# Patient Record
Sex: Male | Born: 1935 | Race: White | Hispanic: No | Marital: Married | Smoking: Never smoker
Health system: Southern US, Community
[De-identification: ages and names within clinical notes are randomized; demographics above are authoritative.]

## PROBLEM LIST (undated history)

## (undated) DIAGNOSIS — R5383 Other fatigue: Secondary | ICD-10-CM

## (undated) DIAGNOSIS — E785 Hyperlipidemia, unspecified: Secondary | ICD-10-CM

## (undated) DIAGNOSIS — I341 Nonrheumatic mitral (valve) prolapse: Secondary | ICD-10-CM

## (undated) DIAGNOSIS — I480 Paroxysmal atrial fibrillation: Secondary | ICD-10-CM

## (undated) HISTORY — DX: Other fatigue: R53.83

## (undated) HISTORY — PX: TONSILLECTOMY AND ADENOIDECTOMY: SUR1326

## (undated) HISTORY — DX: Hyperlipidemia, unspecified: E78.5

## (undated) HISTORY — DX: Paroxysmal atrial fibrillation: I48.0

## (undated) HISTORY — DX: Nonrheumatic mitral (valve) prolapse: I34.1

---

## 2003-11-07 ENCOUNTER — Ambulatory Visit (HOSPITAL_COMMUNITY): Admission: RE | Admit: 2003-11-07 | Discharge: 2003-11-07 | Payer: Self-pay | Admitting: Gastroenterology

## 2004-06-19 ENCOUNTER — Inpatient Hospital Stay (HOSPITAL_COMMUNITY): Admission: EM | Admit: 2004-06-19 | Discharge: 2004-06-22 | Payer: Self-pay | Admitting: Emergency Medicine

## 2004-06-19 HISTORY — PX: CARDIAC CATHETERIZATION: SHX172

## 2004-07-17 ENCOUNTER — Emergency Department (HOSPITAL_COMMUNITY): Admission: EM | Admit: 2004-07-17 | Discharge: 2004-07-17 | Payer: Self-pay | Admitting: Emergency Medicine

## 2006-04-14 ENCOUNTER — Inpatient Hospital Stay (HOSPITAL_COMMUNITY): Admission: EM | Admit: 2006-04-14 | Discharge: 2006-04-17 | Payer: Self-pay | Admitting: Emergency Medicine

## 2006-04-14 HISTORY — PX: CARDIOVERSION: SHX1299

## 2012-10-28 ENCOUNTER — Telehealth: Payer: Self-pay | Admitting: Cardiovascular Disease

## 2012-10-28 MED ORDER — PROPAFENONE HCL 150 MG PO TABS: 150.0000 mg | ORAL_TABLET | Freq: Three times a day (TID) | ORAL | Status: DC

## 2012-10-28 NOTE — Telephone Encounter (Signed)
Rx was sent to pharmacy electronically. 

## 2012-10-28 NOTE — Telephone Encounter (Signed)
No mess

## 2013-01-07 ENCOUNTER — Other Ambulatory Visit: Payer: Self-pay

## 2013-01-07 MED ORDER — ATENOLOL 25 MG PO TABS: 25.0000 mg | ORAL_TABLET | Freq: Every day | ORAL | Status: DC

## 2013-01-07 NOTE — Telephone Encounter (Signed)
Rx was sent to pharmacy electronically. 

## 2013-01-13 ENCOUNTER — Other Ambulatory Visit: Payer: Self-pay | Admitting: Cardiovascular Disease

## 2013-01-13 NOTE — Telephone Encounter (Signed)
Rx was sent to pharmacy electronically. 

## 2013-01-19 ENCOUNTER — Other Ambulatory Visit: Payer: Self-pay | Admitting: *Deleted

## 2013-01-19 MED ORDER — PROPAFENONE HCL 150 MG PO TABS: 150.0000 mg | ORAL_TABLET | Freq: Three times a day (TID) | ORAL | Status: DC

## 2013-01-19 NOTE — Telephone Encounter (Signed)
Rx was sent to pharmacy electronically. 

## 2013-06-21 ENCOUNTER — Other Ambulatory Visit: Payer: Self-pay | Admitting: *Deleted

## 2013-06-21 MED ORDER — PROPAFENONE HCL 150 MG PO TABS: 150.0000 mg | ORAL_TABLET | Freq: Three times a day (TID) | ORAL | Status: DC

## 2013-06-21 NOTE — Telephone Encounter (Signed)
Rx refill sent to patient pharmacy   

## 2013-06-22 ENCOUNTER — Other Ambulatory Visit: Payer: Self-pay | Admitting: *Deleted

## 2013-07-14 ENCOUNTER — Other Ambulatory Visit: Payer: Self-pay | Admitting: Cardiovascular Disease

## 2013-07-14 MED ORDER — ROSUVASTATIN CALCIUM 10 MG PO TABS: 10.0000 mg | ORAL_TABLET | Freq: Every day | ORAL | Status: DC

## 2013-07-28 ENCOUNTER — Telehealth: Payer: Self-pay | Admitting: Cardiovascular Disease

## 2013-07-28 MED ORDER — PROPAFENONE HCL 150 MG PO TABS: 150.0000 mg | ORAL_TABLET | Freq: Three times a day (TID) | ORAL | Status: DC

## 2013-07-28 NOTE — Telephone Encounter (Signed)
Rx was sent to pharmacy electronically. Patient notified.  

## 2013-07-28 NOTE — Telephone Encounter (Signed)
He need enough medicine until his appt on Monday,08-02-13 please. Please call his Propafenone HCL 150 mg. Please call to [facility] [phone].

## 2013-08-02 ENCOUNTER — Encounter: Payer: Self-pay | Admitting: Cardiology

## 2013-08-02 ENCOUNTER — Ambulatory Visit (INDEPENDENT_AMBULATORY_CARE_PROVIDER_SITE_OTHER): Payer: Medicare Other | Admitting: Cardiology

## 2013-08-02 VITALS — BP 126/82 | HR 55 | Ht 73.5 in | Wt 184.8 lb

## 2013-08-02 DIAGNOSIS — I341 Nonrheumatic mitral (valve) prolapse: Secondary | ICD-10-CM

## 2013-08-02 DIAGNOSIS — I059 Rheumatic mitral valve disease, unspecified: Secondary | ICD-10-CM

## 2013-08-02 DIAGNOSIS — I4891 Unspecified atrial fibrillation: Secondary | ICD-10-CM

## 2013-08-02 DIAGNOSIS — I480 Paroxysmal atrial fibrillation: Secondary | ICD-10-CM

## 2013-08-02 DIAGNOSIS — I340 Nonrheumatic mitral (valve) insufficiency: Secondary | ICD-10-CM

## 2013-08-02 DIAGNOSIS — E785 Hyperlipidemia, unspecified: Secondary | ICD-10-CM

## 2013-08-02 DIAGNOSIS — Z79899 Other long term (current) drug therapy: Secondary | ICD-10-CM

## 2013-08-02 LAB — LIPID PANEL
CHOL/HDL RATIO: 2.7 ratio
CHOLESTEROL: 154 mg/dL (ref 0–200)
HDL: 57 mg/dL (ref >39)
LDL Cholesterol: 80 mg/dL (ref 0–99)
TRIGLYCERIDES: 84 mg/dL (ref <150)
VLDL: 17 mg/dL (ref 0–40)

## 2013-08-02 LAB — COMPREHENSIVE METABOLIC PANEL WITH GFR
ALT: 10 U/L (ref 0–53)
AST: 15 U/L (ref 0–37)
Albumin: 4.1 g/dL (ref 3.5–5.2)
Alkaline Phosphatase: 41 U/L (ref 39–117)
BUN: 17 mg/dL (ref 6–23)
CO2: 27 meq/L (ref 19–32)
Calcium: 9 mg/dL (ref 8.4–10.5)
Chloride: 102 meq/L (ref 96–112)
Creat: 0.93 mg/dL (ref 0.50–1.35)
Glucose, Bld: 83 mg/dL (ref 70–99)
Potassium: 4.5 meq/L (ref 3.5–5.3)
Sodium: 137 meq/L (ref 135–145)
Total Bilirubin: 1.4 mg/dL — ABNORMAL HIGH (ref 0.2–1.2)
Total Protein: 6.1 g/dL (ref 6.0–8.3)

## 2013-08-02 MED ORDER — ATENOLOL 25 MG PO TABS: 12.5000 mg | ORAL_TABLET | Freq: Every day | ORAL | Status: DC

## 2013-08-02 MED ORDER — ROSUVASTATIN CALCIUM 10 MG PO TABS: 10.0000 mg | ORAL_TABLET | Freq: Every day | ORAL | Status: DC

## 2013-08-02 MED ORDER — PROPAFENONE HCL 150 MG PO TABS: 150.0000 mg | ORAL_TABLET | Freq: Three times a day (TID) | ORAL | Status: DC

## 2013-08-02 NOTE — Progress Notes (Signed)
Patient ID: Gabriel Schmidt, male   DOB: [birth year], 79 y.o.   MRN: [id number]    08/02/2013 Gabriel Schmidt   December 22, 1934  [id number]  Primary Physicia  Melinda Crutch, MD Primary Cardiologist: Dr. Claiborne Billings  HPI:  The patient is a 79 year old male, followed by Dr. Claiborne Billings. His past medical history is significant for paroxysmal atrial fibrillation, with his last episode being in February 2008. He has been on a combination of Rythmol as well as low-dose beta blockage with atenolol. His Coumadin was discontinued in September 2011, due to a low CHADS score. He has been maintained on 325 mg of aspirin daily. He also has a history of hyperlipidemia, currently on Crestor,  as well as mitral valve prolapse with mild mitral regurgitation.  His last office visit was with Dr. Claiborne Billings over a year ago in March 2014. At that time he was doing fairly well. An EKG showed sinus bradycardia with first-degree AV block with PR interval of 217 ms. QTc interval was 424 ms. He had endorse fatigability and since he was bradycardic, Dr. Claiborne Billings reduced his atenolol dose from 25 mg to 12.5 mg. He was continued on Rythmol at a dose of 150 mg TID, as well as full dose aspirin. A lipid panel was also ordered, which demonstrated a total cholesterol 175, triglycerides 74, HDL 72 and LDL 88. He had normal LFTs. He was continued on 10 mg of Crestor.  Presents back to clinic today for routine evaluation. He states that he has been doing well since his last office visit with Dr. Claiborne Billings. He has not had any issues and has no complaints. He denies chest pain, shortness of breath, orthopnea, PND, LLL, dizziness, fatigue, syncope/near-syncope. He has been fully compliant with his medications. He reports that he is in need for refills for his Rythmol, atenolol and Crestor. His only request is if he can discontinue some of his medications.    Current Outpatient Prescriptions  Medication Sig Dispense Refill  . aspirin 325 MG tablet Take 325 mg by mouth  daily.      Marland Kitchen atenolol (TENORMIN) 25 MG tablet Take 0.5 tablets (12.5 mg total) by mouth daily.  30 tablet  6  . propafenone (RYTHMOL) 150 MG tablet Take 1 tablet (150 mg total) by mouth every 8 (eight) hours.  30 tablet  6  . rosuvastatin (CRESTOR) 10 MG tablet Take 1 tablet (10 mg total) by mouth daily. Need appointments before further refills  90 tablet  3   No current facility-administered medications for this visit.    Allergies  Allergen Reactions  . Penicillins     History   Social History  . Marital Status: Married    Spouse Name: N/A    Number of Children: N/A  . Years of Education: N/A   Occupational History  . Not on file.   Social History Main Topics  . Smoking status: Never Smoker   . Smokeless tobacco: Never Used  . Alcohol Use: 1.5 - 2.0 oz/week    3-4 drink(s) per week  . Drug Use: Not on file  . Sexual Activity: Not on file   Other Topics Concern  . Not on file   Social History Narrative  . No narrative on file     Review of Systems: General: negative for chills, fever, night sweats or weight changes.  Cardiovascular: negative for chest pain, dyspnea on exertion, edema, orthopnea, palpitations, paroxysmal nocturnal dyspnea or shortness of breath Dermatological: negative for rash Respiratory: negative for cough  or wheezing Urologic: negative for hematuria Abdominal: negative for nausea, vomiting, diarrhea, bright red blood per rectum, melena, or hematemesis Neurologic: negative for visual changes, syncope, or dizziness All other systems reviewed and are otherwise negative except as noted above.    Blood pressure 126/82, pulse 55, height 6' 1.5" (1.867 m), weight 184 lb 12.8 oz (83.825 kg).  General appearance: alert, cooperative and no distress Neck: no carotid bruit and no JVD Lungs: clear to auscultation bilaterally Heart: regular rate and rhythm and 1/6 SM Extremities: no LEE Pulses: 2+ and symmetric Skin: warm and dry Neurologic: Grossly  normal  EKG Sinus bradycardia, HR 55 bpm. QT/QTc 456/436 ms  ASSESSMENT AND PLAN:  79 year old, well appearing, male a history of PAF, hyperlipidemia and mitral valve prolapse who presents to clinic for routine cardiac evaluation.  1. Paroxysmal atrial fibrillation: Stable. He denies any recurrence/ symptoms of AFib. His EKG demonstrates sinus bradycardia w/ HR of 55 bpm. He apparently has not had an episode of Afib since 2008 and he wishes to discontinue his medications. However, I question whether or not he has maintained sinus rhythm because he has been medicated with rhythm/rate control therapies. I feel that this decision will need to be made by his primary cardiologist, Dr. Claiborne Billings. For now, I have instructed him to continue Rythmol, atenolol and full dose ASA, until he is seen by Dr. Claiborne Billings.   2. Hyperlipidemia: He reports daily compliance with Crestor. It has been over a year since his last lipid study. He tells me that he has been fasting and has had nothing to eat or drink since midnight. Will repeat lipid panel today. Continue 10 mg of Crestor for now. We'll also check a CMP to assess hepatic function as well as renal function.  3. Mitral valve prolapse with mild mitral regurgitation: His murmur remaines soft. He denies any symptoms of heart failure, including dyspnea on exertion, resting dyspnea, orthopnea, PND and LEE.  PLAN  Continue plan as outlined above. I his Rythmol, atenolol and Crestor reordered. He has been instructed to followup with Dr. Claiborne Billings in 3-6 months for reevaluation or sooner if needed.  Gabriel Shock SimmonsPA-C 08/02/2013 8:32 AM

## 2013-08-02 NOTE — Patient Instructions (Signed)
Your physician recommends that you schedule a follow-up appointment in: 6 Months or sooner if needed  Your physician has recommended you make the following change in your medication: Decrease Atenolol to 12.5(1/2) tablets daily  Your physician recommends that you return for lab work CMP, FASTING LIPIDS

## 2013-08-17 ENCOUNTER — Other Ambulatory Visit: Payer: Self-pay | Admitting: *Deleted

## 2013-08-17 MED ORDER — PROPAFENONE HCL 150 MG PO TABS: 150.0000 mg | ORAL_TABLET | Freq: Three times a day (TID) | ORAL | Status: DC

## 2013-08-17 NOTE — Telephone Encounter (Signed)
Rx refill sent into patient pharmacy

## 2013-09-01 ENCOUNTER — Telehealth: Payer: Self-pay | Admitting: Cardiovascular Disease

## 2013-09-01 NOTE — Telephone Encounter (Signed)
Spoke with pt, lab results given to pt and mailed to his home address at his request.

## 2013-09-01 NOTE — Telephone Encounter (Signed)
Pt would like his lab results from June please.

## 2014-01-28 ENCOUNTER — Other Ambulatory Visit: Payer: Self-pay | Admitting: *Deleted

## 2014-01-28 MED ORDER — ATENOLOL 25 MG PO TABS: 12.5000 mg | ORAL_TABLET | Freq: Every day | ORAL | Status: DC

## 2014-01-28 MED ORDER — PROPAFENONE HCL 150 MG PO TABS: 150.0000 mg | ORAL_TABLET | Freq: Three times a day (TID) | ORAL | Status: DC

## 2014-01-28 NOTE — Telephone Encounter (Signed)
Refilled electronically 

## 2014-02-02 ENCOUNTER — Other Ambulatory Visit: Payer: Self-pay

## 2014-02-02 MED ORDER — ATENOLOL 25 MG PO TABS: 12.5000 mg | ORAL_TABLET | Freq: Every day | ORAL | Status: DC

## 2014-02-02 MED ORDER — PROPAFENONE HCL 150 MG PO TABS: 150.0000 mg | ORAL_TABLET | Freq: Three times a day (TID) | ORAL | Status: DC

## 2014-02-02 NOTE — Telephone Encounter (Signed)
Rx sent to pharmacy   

## 2014-02-02 NOTE — Telephone Encounter (Signed)
Rx was sent to pharmacy electronically. 

## 2014-03-21 ENCOUNTER — Telehealth: Payer: Self-pay | Admitting: Cardiovascular Disease

## 2014-03-21 NOTE — Telephone Encounter (Signed)
Mrs. Haffey states that her husband has been experiencing a low heart rate (47-52) since last Friday 03/18/14.

## 2014-03-21 NOTE — Telephone Encounter (Signed)
Is he still taking propefenone and atenolol. If so, then can dc atenolol if only taking 12.5 mg. Rec f/u ov

## 2014-03-21 NOTE — Telephone Encounter (Signed)
Returned call to patient's wife she stated she wanted to ask Dr.Kelly if husband needs to decrease medication.Stated he has been exercising regular.Stated B/P ranging 98/51,102/53,124/70,128/71.Pulse ranging [phone] [phone].Stated she was concerned when pulse was 48,47.Stated he felt good.Advised pulse ok.Follow up appointment scheduled with [facility] 04/13/14 at 10:15 am.Message sent to [facility] for advice.

## 2014-03-22 NOTE — Telephone Encounter (Signed)
Spoke to patient. He verified current dose of atenolol 12.5mg  daily. Communicated Dr. Evette Georges instructions, patient voiced understanding, pt will d/c Atenolol. Will keep OV for 2/17 and call for any concerns.

## 2014-04-13 ENCOUNTER — Ambulatory Visit (INDEPENDENT_AMBULATORY_CARE_PROVIDER_SITE_OTHER): Payer: Medicare Other | Admitting: Cardiovascular Disease

## 2014-04-13 ENCOUNTER — Encounter: Payer: Self-pay | Admitting: Cardiovascular Disease

## 2014-04-13 VITALS — BP 118/70 | HR 59 | Ht 74.0 in | Wt 182.9 lb

## 2014-04-13 DIAGNOSIS — I340 Nonrheumatic mitral (valve) insufficiency: Secondary | ICD-10-CM

## 2014-04-13 DIAGNOSIS — Z79899 Other long term (current) drug therapy: Secondary | ICD-10-CM

## 2014-04-13 DIAGNOSIS — I480 Paroxysmal atrial fibrillation: Secondary | ICD-10-CM

## 2014-04-13 DIAGNOSIS — E785 Hyperlipidemia, unspecified: Secondary | ICD-10-CM

## 2014-04-13 DIAGNOSIS — I341 Nonrheumatic mitral (valve) prolapse: Secondary | ICD-10-CM

## 2014-04-13 NOTE — Patient Instructions (Addendum)
Your physician recommends that you return for lab work fasting.  Your physician recommends that you schedule a follow-up appointment in: 3 months with Dr. Claiborne Billings.  Dr Claiborne Billings wants you to follow the schedule below for the propafenone:  Take 1 tablet twice daily for 2 weeks.  Then 1/2 tablet twice a day for 1 week.  Then 1/2 tablet daily for 1 week then stop!  2 days later restart the atenolol @ 1/2 tablet daily for 2 weeks then take 1/2 tablet twice a day.  Decrease the aspirin to 81 mg.

## 2014-04-15 ENCOUNTER — Encounter: Payer: Self-pay | Admitting: Cardiovascular Disease

## 2014-04-15 NOTE — Progress Notes (Signed)
Patient ID: Gabriel Schmidt, male   DOB: Mar 29, 1934, 80 y.o.   MRN: [id number]     HPI: Gabriel Schmidt is a 80 y.o. male who presents to the office today for a 23 month follow up cardiology evaluation.  Gabriel Schmidt is a 80 year old gentleman who has a history of paroxysmal atrial fibrillation with 2 documented episodes over the past 10 years. One episode was in 2006 and his last episode was in February 2008.  He has been on combination therapy with Rythmol as well as low-dose beta blocker therapy.  He had been maintained on Coumadin and this was ultimately discontinued in September 2011.  He also has a history of hyperlipidemia for which she's been treated with Crestor 10 mg, mitral valve prolapse with mild MR, and remotely had noted some fatigability.  I last saw him in March 2014 at which time he was doing well on current therapy.  He apparently had seen Gabriel Schmidt in June 2015 and at that time expressed to her his wishes to discontinue Rythmol if possible.  She did not feel comfortable making that decision.  Over the past 8 months, he continues to remain stable.  In the past the episodes of paroxysmal atrial fibrillation occurred in the setting of more significant alcohol intake.  Presently, he is exercising regularly.  He works out 5 days per week.  He has remained in good physical shape.  He denies any weight gain.  He denies chest pain.  He denies shortness of breath.  He denies bleeding.  I did review recent blood work which was done at [place] in December 2015.  Hemoglobin 15, hematocrit 45.4.  Platelet count was normal at 255.  Lipids were excellent with a total cholesterol 153, triglycerides 89, HDL 57, LDL 78.   History reviewed. No pertinent past medical history.  History reviewed. No pertinent past surgical history.  Allergies  Allergen Reactions  . Penicillins     Current Outpatient Prescriptions  Medication Sig Dispense Refill  . aspirin 81 MG tablet Take 81 mg by mouth daily.     . Omega-3 Fatty Acids (ULTRA OMEGA-3 FISH OIL) 1400 MG CAPS Take 1 capsule by mouth daily.    . propafenone (RYTHMOL) 150 MG tablet Take 1 tablet (150 mg total) by mouth every 8 (eight) hours. <please make appointment for future refills> 270 tablet 0  . rosuvastatin (CRESTOR) 10 MG tablet Take 1 tablet (10 mg total) by mouth daily. Need appointments before further refills 90 tablet 3  . atenolol (TENORMIN) 25 MG tablet Take 0.5 tablets (12.5 mg total) by mouth daily. Need an appointment for additional refills (Patient not taking: Reported on 04/13/2014) 90 tablet 0   No current facility-administered medications for this visit.    History   Social History  . Marital Status: Married    Spouse Name: N/A  . Number of Children: N/A  . Years of Education: N/A   Occupational History  . Not on file.   Social History Main Topics  . Smoking status: Never Smoker   . Smokeless tobacco: Never Used  . Alcohol Use: 1.5 - 2.0 oz/week    3-4 Standard drinks or equivalent per week  . Drug Use: Not on file  . Sexual Activity: Not on file   Other Topics Concern  . Not on file   Social History Narrative   Socially he is retired.  He has 3 children.  He previously was a [employer].  Family history is notable that  both parents are deceased.  Father died of a stroke.  ROS General: Negative; No fevers, chills, or night sweats HEENT: Negative; No changes in vision or hearing, sinus congestion, difficulty swallowing Pulmonary: Negative; No cough, wheezing, shortness of breath, hemoptysis Cardiovascular: See HPI: No chest pain, presyncope, syncope, palpatations GI: Negative; No nausea, vomiting, diarrhea, or abdominal pain GU: Negative; No dysuria, hematuria, or difficulty voiding Musculoskeletal: Negative; no myalgias, joint pain, or weakness Hematologic: Negative; no easy bruising, bleeding Endocrine: Negative; no heat/cold intolerance; no diabetes, Neuro: Negative; no changes in  balance, headaches Skin: Negative; No rashes or skin lesions Psychiatric: Negative; No behavioral problems, depression Sleep: Negative; No snoring,  daytime sleepiness, hypersomnolence, bruxism, restless legs, hypnogognic hallucinations. Other comprehensive 14 point system review is negative   Physical Exam BP 118/70 mmHg  Pulse 59  Ht '6\' 2"'  (1.88 m)  Wt 182 lb 14.4 oz (82.963 kg)  BMI 23.47 kg/m2 General: Alert, oriented, no distress.  Skin: normal turgor, no rashes, warm and dry HEENT: Normocephalic, atraumatic. Pupils equal round and reactive to light; sclera anicteric; extraocular muscles intact, No lid lag; Nose without nasal septal hypertrophy; Mouth/Parynx benign; Mallinpatti scale 2 Neck: No JVD, no carotid bruits; normal carotid upstroke Lungs: clear to ausculatation and percussion bilaterally; no wheezing or rales, normal inspiratory and expiratory effort Chest wall: without tenderness to palpitation Heart: PMI not displaced, RRR, s1 s2 normal, 1/6 systolic murmur, No diastolic murmur, no rubs, gallops, thrills, or heaves Abdomen: soft, nontender; no hepatosplenomehaly, BS+; abdominal aorta nontender and not dilated by palpation. Back: no CVA tenderness Pulses: 2+  Musculoskeletal: full range of motion, normal strength, no joint deformities Extremities: Pulses 2+, no clubbing cyanosis or edema, Homan's sign negative  Neurologic: grossly nonfocal; Cranial nerves grossly wnl Psychologic: Normal mood and affect   ECG (independently read by me): Sinus bradycardia 59 bpm.  Borderline first-degree AV block with a PR interval of 24 ms.  No significant ST segment changes.  LABS:  BMP Latest Ref Rng 08/02/2013  Glucose 70 - 99 mg/dL 83  BUN 6 - 23 mg/dL 17  Creatinine 0.50 - 1.35 mg/dL 0.93  Sodium 135 - 145 mEq/L 137  Potassium 3.5 - 5.3 mEq/L 4.5  Chloride 96 - 112 mEq/L 102  CO2 19 - 32 mEq/L 27  Calcium 8.4 - 10.5 mg/dL 9.0     Hepatic Function Latest Ref Rng 08/02/2013    Total Protein 6.0 - 8.3 g/dL 6.1  Albumin 3.5 - 5.2 g/dL 4.1  AST 0 - 37 U/L 15  ALT 0 - 53 U/L 10  Alk Phosphatase 39 - 117 U/L 41  Total Bilirubin 0.2 - 1.2 mg/dL 1.4(H)     No flowsheet data found.   BNP No results found for: BNP  ProBNP No results found for: PROBNP   Lipid Panel     Component Value Date/Time   CHOL 154 08/02/2013 0835   TRIG 84 08/02/2013 0835   HDL 57 08/02/2013 0835   CHOLHDL 2.7 08/02/2013 0835   VLDL 17 08/02/2013 0835   LDLCALC 80 08/02/2013 0835     RADIOLOGY: No results found.    ASSESSMENT AND PLAN: Mr. Gabriel Schmidt has had 2 documented episodes of atrial fibrillation over the last 10 years with one in 2006 and his last episode in 2008.  Remotely, he had experienced a previous episode that was short lived while [place] in [place], [place].  He would like to see if he can discontinue his per puff alone.  He had  been maintained on atenolol, but since he had been feeling well over the past several months.  He actually has self discontinued this beta blocker regimen.  Discussion, I have consented to a trial of slow wean off Rythmol with re-initiation of atenolol.  Presently, he has been taking Rythmol 150 mg every 8 hours.  For the next 2 weeks she will reduce this to twice a day and then he will reduce this to one half twice a day for 1 week and then one half daily for 1 week prior to completely discontinuing therapy.  He will resume atenolol at 12.5 mg daily and after's several weeks.  He will then further titrate this to 12.5 mg twice a day.  He will contact me if he is unaware of any recurrent arrhythmia.  He was current aspirin, but I have suggested he change this to 81 mg.  I reviewed his recent laboratory.  His lipids are excellent.  His blood pressure today is well controlled.  I will see him in 3 months for reevaluation or sooner if problems exist.     Troy Sine, MD, [facility]  04/15/2014 6:48 PM

## 2014-04-26 ENCOUNTER — Encounter: Payer: Self-pay | Admitting: Cardiovascular Disease

## 2014-04-28 ENCOUNTER — Encounter: Payer: Self-pay | Admitting: *Deleted

## 2014-06-30 ENCOUNTER — Other Ambulatory Visit: Payer: Self-pay | Admitting: Cardiovascular Disease

## 2014-06-30 MED ORDER — ATENOLOL 25 MG PO TABS: 12.5000 mg | ORAL_TABLET | Freq: Two times a day (BID) | ORAL | Status: DC

## 2014-06-30 NOTE — Telephone Encounter (Signed)
°  1. Which medications need to be refilled? Atenolol (dosage change to 1 tab po a day )  2. Which pharmacy is medication to be sent to?[facility]  3. Do they need a 30 day or 90 day supply? 90  4. Would they like a call back once the medication has been sent to the pharmacy? Yes

## 2014-06-30 NOTE — Telephone Encounter (Signed)
Rx(s) sent to pharmacy electronically. Patient notified. 

## 2014-07-13 ENCOUNTER — Other Ambulatory Visit: Payer: Self-pay | Admitting: Cardiovascular Disease

## 2014-07-14 LAB — CBC/DIFF AMBIGUOUS DEFAULT
BASOS ABS: 0 10*3/uL (ref 0.0–0.2)
Basos: 0 %
EOS (ABSOLUTE): 0.2 10*3/uL (ref 0.0–0.4)
Eos: 5 %
Hematocrit: 43.9 % (ref 37.5–51.0)
Hemoglobin: 15 g/dL (ref 12.6–17.7)
Immature Grans (Abs): 0 10*3/uL (ref 0.0–0.1)
Immature Granulocytes: 0 %
LYMPHS ABS: 1.5 10*3/uL (ref 0.7–3.1)
Lymphs: 31 %
MCH: 30.8 pg (ref 26.6–33.0)
MCHC: 34.2 g/dL (ref 31.5–35.7)
MCV: 90 fL (ref 79–97)
MONOS ABS: 0.6 10*3/uL (ref 0.1–0.9)
Monocytes: 12 %
NEUTROS ABS: 2.5 10*3/uL (ref 1.4–7.0)
NEUTROS PCT: 52 %
PLATELETS: 263 10*3/uL (ref 150–379)
RBC: 4.87 x10E6/uL (ref 4.14–5.80)
RDW: 13.1 % (ref 12.3–15.4)
WBC: 4.8 10*3/uL (ref 3.4–10.8)

## 2014-07-14 LAB — COMPREHENSIVE METABOLIC PANEL
ALBUMIN: 4 g/dL (ref 3.5–4.8)
ALT: 11 IU/L (ref 0–44)
AST: 20 IU/L (ref 0–40)
Albumin/Globulin Ratio: 2.1 (ref 1.1–2.5)
Alkaline Phosphatase: 47 IU/L (ref 39–117)
BUN/Creatinine Ratio: 14 (ref 10–22)
BUN: 14 mg/dL (ref 8–27)
Bilirubin Total: 1 mg/dL (ref 0.0–1.2)
CALCIUM: 9.1 mg/dL (ref 8.6–10.2)
CHLORIDE: 103 mmol/L (ref 97–108)
CO2: 24 mmol/L (ref 18–29)
Creatinine, Ser: 0.98 mg/dL (ref 0.76–1.27)
GFR, EST AFRICAN AMERICAN: 86 mL/min/{1.73_m2} (ref >59)
GFR, EST NON AFRICAN AMERICAN: 75 mL/min/{1.73_m2} (ref >59)
GLUCOSE: 92 mg/dL (ref 65–99)
Globulin, Total: 1.9 g/dL (ref 1.5–4.5)
Potassium: 5 mmol/L (ref 3.5–5.2)
Sodium: 142 mmol/L (ref 134–144)
TOTAL PROTEIN: 5.9 g/dL — AB (ref 6.0–8.5)

## 2014-07-14 LAB — LIPID PANEL W/O CHOL/HDL RATIO
Cholesterol, Total: 141 mg/dL (ref 100–199)
HDL: 64 mg/dL (ref >39)
LDL CALC: 61 mg/dL (ref 0–99)
Triglycerides: 78 mg/dL (ref 0–149)
VLDL Cholesterol Cal: 16 mg/dL (ref 5–40)

## 2014-07-14 LAB — TSH: TSH: 1.7 u[IU]/mL (ref 0.450–4.500)

## 2014-07-14 LAB — AMBIG ABBREV LP DEFAULT

## 2014-07-14 LAB — AMBIG ABBREV CMP14 DEFAULT

## 2014-07-19 ENCOUNTER — Encounter: Payer: Self-pay | Admitting: Cardiovascular Disease

## 2014-07-19 ENCOUNTER — Ambulatory Visit (INDEPENDENT_AMBULATORY_CARE_PROVIDER_SITE_OTHER): Payer: Medicare Other | Admitting: Cardiovascular Disease

## 2014-07-19 VITALS — BP 106/58 | HR 59 | Ht 74.0 in | Wt 180.0 lb

## 2014-07-19 DIAGNOSIS — I480 Paroxysmal atrial fibrillation: Secondary | ICD-10-CM | POA: Diagnosis not present

## 2014-07-19 DIAGNOSIS — E785 Hyperlipidemia, unspecified: Secondary | ICD-10-CM

## 2014-07-19 DIAGNOSIS — I341 Nonrheumatic mitral (valve) prolapse: Secondary | ICD-10-CM

## 2014-07-19 NOTE — Progress Notes (Signed)
Patient ID: Gabriel Schmidt, male   DOB: 11-Oct-1934, 80 y.o.   MRN: [id number]     HPI: Gabriel Schmidt is a 80 y.o. male who presents to the office today for a 3 month follow up cardiology evaluation.  [name] has a history of paroxysmal atrial fibrillation with 2 documented episodes over the past 10 years. One episode was in 2006 and his last episode was in February 2008.  He has been on combination therapy with Rythmol as well as low-dose beta blocker therapy.  He had been maintained on Coumadin and this was ultimately discontinued in September 2011.  He also has a history of hyperlipidemia for which she's been treated with Crestor 10 mg, mitral valve prolapse with mild MR, and remotely had noted some fatigability.  I last saw him in March 2014 at which time he was doing well on current therapy.  He apparently had seen Lesia Hausen in June 2015 and at that time expressed to her his wishes to discontinue Rythmol if possible.  She did not feel comfortable making that decision.  When I last saw him in February 2017, I recommended that he try to wean off Rythmol which he had done over a three-week period.  Since that time, he has been on atenolol 25 mg daily in addition to aspirin 81 mg, omega-3 fatty acids, and Crestor 10 mg.  He is unaware of any breakthrough rhythm disturbance.  He continues to remain stable.  In the past the episodes of paroxysmal atrial fibrillation occurred in the setting of more significant alcohol intake.  Presently, he is exercising regularly.  He works out 5 days per week.  He has remained in good physical shape.  He denies any weight gain.  He denies chest pain.  He denies shortness of breath.  He denies bleeding.  He had recent laboratory last week which showed a hemoglobin of 15, hematocrit 43.9.  Lipid studies were excellent with a total cholesterol 141, triglycerides 78, HDL 64, and LDL 61.  TSH is normal 1.7.  He presents for evaluation.  History reviewed. No pertinent  past medical history.  History reviewed. No pertinent past surgical history.  Allergies  Allergen Reactions  . Penicillins     Current Outpatient Prescriptions  Medication Sig Dispense Refill  . aspirin 81 MG tablet Take 81 mg by mouth daily.    Marland Kitchen atenolol (TENORMIN) 25 MG tablet Take 25 mg by mouth daily.    . Omega-3 Fatty Acids (ULTRA OMEGA-3 FISH OIL) 1400 MG CAPS Take 1 capsule by mouth daily.    . rosuvastatin (CRESTOR) 10 MG tablet Take 1 tablet (10 mg total) by mouth daily. Need appointments before further refills 90 tablet 3   No current facility-administered medications for this visit.    History   Social History  . Marital Status: Married    Spouse Name: N/A  . Number of Children: N/A  . Years of Education: N/A   Occupational History  . Not on file.   Social History Main Topics  . Smoking status: Never Smoker   . Smokeless tobacco: Never Used  . Alcohol Use: 1.5 - 2.0 oz/week    3-4 Standard drinks or equivalent per week  . Drug Use: Not on file  . Sexual Activity: Not on file   Other Topics Concern  . Not on file   Social History Narrative   Socially he is retired.  He has 3 children.  He previously was a Land.  Family history is notable that both parents are deceased.  Father died of a stroke.  ROS General: Negative; No fevers, chills, or night sweats HEENT: Negative; No changes in vision or hearing, sinus congestion, difficulty swallowing Pulmonary: Negative; No cough, wheezing, shortness of breath, hemoptysis Cardiovascular: See HPI: No chest pain, presyncope, syncope, palpatations GI: Negative; No nausea, vomiting, diarrhea, or abdominal pain GU: Negative; No dysuria, hematuria, or difficulty voiding Musculoskeletal: Negative; no myalgias, joint pain, or weakness Hematologic: Negative; no easy bruising, bleeding Endocrine: Negative; no heat/cold intolerance; no diabetes, Neuro: Negative; no changes in balance, headaches Skin:  Negative; No rashes or skin lesions Psychiatric: Negative; No behavioral problems, depression Sleep: Negative; No snoring,  daytime sleepiness, hypersomnolence, bruxism, restless legs, hypnogognic hallucinations. Other comprehensive 14 point system review is negative   Physical Exam BP 106/58 mmHg  Pulse 59  Ht 6' 2" (1.88 m)  Wt 180 lb (81.647 kg)  BMI 23.10 kg/m2 General: Alert, oriented, no distress.  Skin: normal turgor, no rashes, warm and dry HEENT: Normocephalic, atraumatic. Pupils equal round and reactive to light; sclera anicteric; extraocular muscles intact, No lid lag; Nose without nasal septal hypertrophy; Mouth/Parynx benign; Mallinpatti scale 2 Neck: No JVD, no carotid bruits; normal carotid upstroke Lungs: clear to ausculatation and percussion bilaterally; no wheezing or rales, normal inspiratory and expiratory effort Chest wall: without tenderness to palpitation Heart: PMI not displaced, RRR, s1 s2 normal, 1/6 systolic murmur, No diastolic murmur, no rubs, gallops, thrills, or heaves Abdomen: soft, nontender; no hepatosplenomehaly, BS+; abdominal aorta nontender and not dilated by palpation. Back: no CVA tenderness Pulses: 2+  Musculoskeletal: full range of motion, normal strength, no joint deformities Extremities: Pulses 2+, no clubbing cyanosis or edema, Homan's sign negative  Neurologic: grossly nonfocal; Cranial nerves grossly wnl Psychologic: Normal mood and affect  ECG (independently read by me): Sinus bradycardia 59 bpm.  QTc interval 423 ms.  PR interval 188 ms   February 2016 ECG (independently read by me): Sinus bradycardia 59 bpm.  Borderline first-degree AV block with a PR interval of 24 ms.  No significant ST segment changes.  LABS:  BMP Latest Ref Rng 07/13/2014 08/02/2013  Glucose 65 - 99 mg/dL 92 83  BUN 8 - 27 mg/dL 14 17  Creatinine 0.76 - 1.27 mg/dL 0.98 0.93  BUN/Creat Ratio 10 - 22 14 -  Sodium 134 - 144 mmol/L 142 137  Potassium 3.5 - 5.2  mmol/L 5.0 4.5  Chloride 97 - 108 mmol/L 103 102  CO2 18 - 29 mmol/L 24 27  Calcium 8.6 - 10.2 mg/dL 9.1 9.0     Hepatic Function Latest Ref Rng 07/13/2014 08/02/2013  Total Protein 6.0 - 8.5 g/dL 5.9(L) 6.1  Albumin 3.5 - 5.2 g/dL - 4.1  AST 0 - 40 IU/L 20 15  ALT 0 - 44 IU/L 11 10  Alk Phosphatase 39 - 117 IU/L 47 41  Total Bilirubin 0.0 - 1.2 mg/dL 1.0 1.4(H)     CBC Latest Ref Rng 07/13/2014  WBC 3.4 - 10.8 x10E3/uL 4.8  Hematocrit 37.5 - 51.0 % 43.9     BNP No results found for: BNP  ProBNP No results found for: PROBNP   Lipid Panel     Component Value Date/Time   CHOL 141 07/13/2014 0923   CHOL 154 08/02/2013 0835   TRIG 78 07/13/2014 0923   HDL 64 07/13/2014 0923   HDL 57 08/02/2013 0835   CHOLHDL 2.7 08/02/2013 0835   VLDL 17 08/02/2013 0835   LDLCALC  61 07/13/2014 0923   LDLCALC 80 08/02/2013 0835     RADIOLOGY: No results found.    ASSESSMENT AND PLAN: Mr. Gail Anes has had 2 documented episodes of atrial fibrillation over the last 10 years with one in 2006 and his last episode in 2008.  Remotely, he had experienced a previous episode that was short lived while [place], [place].  Presently, he is maintaining sinus rhythm following discontinuance of Rythmol therapy.  He is on low-dose beta blockade with atenolol at just 25 mg.  His heart rate is 59 bpm in sinus rhythm.  He has resolution of prior first-degree AV block.  His QTc interval is normal.  I reviewed his laboratory.  He is doing well.  Lipids are excellent.  Thyroid function studies is normal.  As long as he remains stable I will see him in one year for reevaluation.    Troy Sine, MD, [facility]  07/19/2014 8:15 AM

## 2014-07-19 NOTE — Patient Instructions (Signed)
Your physician wants you to follow-up in: 1 year or sooner if needed. You will receive a reminder letter in the mail two months in advance. If you don't receive a letter, please call our office to schedule the follow-up appointment.  

## 2014-07-21 ENCOUNTER — Encounter: Payer: Self-pay | Admitting: *Deleted

## 2014-08-12 ENCOUNTER — Other Ambulatory Visit: Payer: Self-pay | Admitting: Cardiology

## 2014-10-24 ENCOUNTER — Telehealth: Payer: Self-pay | Admitting: Cardiovascular Disease

## 2014-10-24 ENCOUNTER — Encounter (HOSPITAL_COMMUNITY): Payer: Self-pay | Admitting: Emergency Medicine

## 2014-10-24 ENCOUNTER — Emergency Department (HOSPITAL_COMMUNITY)
Admission: EM | Admit: 2014-10-24 | Discharge: 2014-10-24 | Disposition: A | Discharge status: Home / Self Care | Payer: Medicare Other | Attending: Emergency Medicine | Admitting: Emergency Medicine

## 2014-10-24 ENCOUNTER — Emergency Department (HOSPITAL_COMMUNITY): Payer: Medicare Other

## 2014-10-24 DIAGNOSIS — E785 Hyperlipidemia, unspecified: Secondary | ICD-10-CM | POA: Diagnosis not present

## 2014-10-24 DIAGNOSIS — Z79899 Other long term (current) drug therapy: Secondary | ICD-10-CM | POA: Insufficient documentation

## 2014-10-24 DIAGNOSIS — I480 Paroxysmal atrial fibrillation: Secondary | ICD-10-CM | POA: Diagnosis not present

## 2014-10-24 DIAGNOSIS — Z880 Allergy status to penicillin: Secondary | ICD-10-CM | POA: Diagnosis not present

## 2014-10-24 DIAGNOSIS — R002 Palpitations: Secondary | ICD-10-CM | POA: Diagnosis present

## 2014-10-24 LAB — CBC WITH DIFFERENTIAL/PLATELET
BASOS PCT: 0 % (ref 0–1)
Basophils Absolute: 0 10*3/uL (ref 0.0–0.1)
EOS ABS: 0.1 10*3/uL (ref 0.0–0.7)
Eosinophils Relative: 3 % (ref 0–5)
HCT: 45.7 % (ref 39.0–52.0)
HEMOGLOBIN: 15.2 g/dL (ref 13.0–17.0)
Lymphocytes Relative: 30 % (ref 12–46)
Lymphs Abs: 1.5 10*3/uL (ref 0.7–4.0)
MCH: 30.1 pg (ref 26.0–34.0)
MCHC: 33.3 g/dL (ref 30.0–36.0)
MCV: 90.5 fL (ref 78.0–100.0)
MONOS PCT: 9 % (ref 3–12)
Monocytes Absolute: 0.4 10*3/uL (ref 0.1–1.0)
NEUTROS PCT: 58 % (ref 43–77)
Neutro Abs: 2.8 10*3/uL (ref 1.7–7.7)
PLATELETS: 260 10*3/uL (ref 150–400)
RBC: 5.05 MIL/uL (ref 4.22–5.81)
RDW: 12.8 % (ref 11.5–15.5)
WBC: 4.9 10*3/uL (ref 4.0–10.5)

## 2014-10-24 LAB — BASIC METABOLIC PANEL
Anion gap: 6 (ref 5–15)
BUN: 16 mg/dL (ref 6–20)
CALCIUM: 9.1 mg/dL (ref 8.9–10.3)
CO2: 28 mmol/L (ref 22–32)
CREATININE: 1.21 mg/dL (ref 0.61–1.24)
Chloride: 106 mmol/L (ref 101–111)
GFR calc non Af Amer: 56 mL/min — ABNORMAL LOW (ref >60)
Glucose, Bld: 114 mg/dL — ABNORMAL HIGH (ref 65–99)
Potassium: 4.2 mmol/L (ref 3.5–5.1)
SODIUM: 140 mmol/L (ref 135–145)

## 2014-10-24 LAB — MAGNESIUM: Magnesium: 2.4 mg/dL (ref 1.7–2.4)

## 2014-10-24 LAB — I-STAT TROPONIN, ED: TROPONIN I, POC: 0.01 ng/mL (ref 0.00–0.08)

## 2014-10-24 MED ORDER — SODIUM CHLORIDE 0.9 % IV SOLN
INTRAVENOUS | Status: AC | PRN
  Administered 2014-10-24: 1000 mL via INTRAVENOUS

## 2014-10-24 MED ORDER — APIXABAN 5 MG PO TABS: 5.0000 mg | ORAL_TABLET | Freq: Two times a day (BID) | ORAL | Status: DC

## 2014-10-24 MED ORDER — PROPOFOL 10 MG/ML IV BOLUS
100.0000 mg | Freq: Once | INTRAVENOUS | Status: AC
  Administered 2014-10-24: 60 mg via INTRAVENOUS
  Filled 2014-10-24: qty 20

## 2014-10-24 MED ORDER — APIXABAN 5 MG PO TABS
5.0000 mg | ORAL_TABLET | Freq: Two times a day (BID) | ORAL | Status: DC
  Administered 2014-10-24: 5 mg via ORAL
  Filled 2014-10-24: qty 1

## 2014-10-24 MED ORDER — PHENYLEPHRINE HCL 10 MG/ML IJ SOLN
0.0000 ug/min | Freq: Once | INTRAVENOUS | Status: DC
  Filled 2014-10-24: qty 1

## 2014-10-24 NOTE — ED Notes (Signed)
Pads placed on pt 

## 2014-10-24 NOTE — ED Provider Notes (Signed)
CSN: [id number]     Arrival date & time 10/24/14  1305 History   First MD Initiated Contact with Patient 10/24/14 1513     Chief Complaint  Patient presents with  . Palpitations     (Consider location/radiation/quality/duration/timing/severity/associated sxs/prior Treatment) HPI Comments: Pt comes in with cc of palpitations. Pt has hx of MVP, paroxysmal afib. He reports that he was at the gym this morning, and started feeling dizzy. He checked his pulse and BP - and his heart rate was too fast and BP was in the 80s. Pt was asked to come to the ER for further evaluation. He has no active chest pain, dib, dizziness. Pt is noted to be in afib currently. He reports feeling great until this morning, and has been living a very active life style. He is absolutely sure that the symptoms started today. He at no point really felt palpitations, although cc mentions that. No recent infection. Pt has no hx of PE, DVT and denies any exogenous estrogen use, long distance travels or surgery in the past 6 weeks, active cancer, recent immobilization. He is not on any anticoagulants at the moment.   ROS 10 Systems reviewed and are negative for acute change except as noted in the HPI.     The history is provided by the patient.    Past Medical History  Diagnosis Date  . Hyperlipidemia   . Paroxysmal a-fib   . Mitral valve prolapse   . Fatigue    Past Surgical History  Procedure Laterality Date  . Tonsillectomy and adenoidectomy    . Cardioversion  04/14/2006  . Cardiac catheterization  06/19/2004   Family History  Problem Relation Age of Onset  . Stroke Father    Social History  Substance Use Topics  . Smoking status: Never Smoker   . Smokeless tobacco: Never Used  . Alcohol Use: 1.8 - 2.4 oz/week    3-4 Standard drinks or equivalent per week    Review of Systems  Constitutional: Positive for activity change.  Neurological: Positive for dizziness.  All other systems reviewed and are  negative.     Allergies  Penicillins  Home Medications   Prior to Admission medications   Medication Sig Start Date End Date Taking? Authorizing Provider  atenolol (TENORMIN) 25 MG tablet Take 25 mg by mouth daily.   Yes Historical Provider, MD  CRESTOR 10 MG tablet Take 1 tablet by mouth  daily 08/12/14  Yes Troy Sine, MD  ranitidine (ZANTAC) 150 MG capsule Take 150 mg by mouth 2 (two) times daily as needed for heartburn.   Yes Historical Provider, MD  apixaban (ELIQUIS) 5 MG TABS tablet Take 1 tablet (5 mg total) by mouth 2 (two) times daily. 10/26/14   Sherran Needs, NP   BP 103/56 mmHg  Pulse 55  Temp(Src) 97.3 F (36.3 C) (Oral)  Resp 10  SpO2 100% Physical Exam  Constitutional: He is oriented to person, place, and time. He appears well-developed.  HENT:  Head: Normocephalic and atraumatic.  Eyes: Conjunctivae and EOM are normal. Pupils are equal, round, and reactive to light.  Neck: Normal range of motion. Neck supple.  Cardiovascular: Normal rate.   Pulmonary/Chest: Effort normal and breath sounds normal. No respiratory distress. He has no wheezes.  Abdominal: Soft. Bowel sounds are normal. He exhibits no distension. There is no tenderness. There is no rebound and no guarding.  Neurological: He is alert and oriented to person, place, and time.  Skin: Skin is  warm.  Nursing note and vitals reviewed.   ED Course  CARDIOVERSION Date/Time: 10/24/2014 7:08 PM Performed by: Varney Biles Authorized by: Varney Biles Consent: Verbal consent obtained. Written consent obtained. Risks and benefits: risks, benefits and alternatives were discussed Consent given by: patient Patient understanding: patient states understanding of the procedure being performed Patient consent: the patient's understanding of the procedure matches consent given Procedure consent: procedure consent matches procedure scheduled Relevant documents: relevant documents present and  verified Test results: test results available and properly labeled Patient identity confirmed: arm band Time out: Immediately prior to procedure a "time out" was called to verify the correct patient, procedure, equipment, support staff and site/side marked as required. Cardioversion basis: elective Pre-procedure rhythm: atrial fibrillation Patient position: patient was placed in a supine position Chest area: chest area exposed Electrodes: pads Electrodes placed: anterior-posterior Number of attempts: 1 Attempt 1 mode: synchronous Attempt 1 waveform: biphasic Attempt 1 shock (in Joules): 150 Attempt 1 outcome: conversion to normal sinus rhythm Post-procedure rhythm: normal sinus rhythm Complications: no complications Patient tolerance: Patient tolerated the procedure well with no immediate complications    Procedural sedation Performed by: Varney Biles Consent: Verbal consent obtained. Risks and benefits: risks, benefits and alternatives were discussed Required items: required blood products, implants, devices, and special equipment available Patient identity confirmed: arm band and provided demographic data Time out: Immediately prior to procedure a "time out" was called to verify the correct patient, procedure, equipment, support staff and site/side marked as required.  Sedation type: deep (conscious) sedation NPO time confirmed and considedered  Sedatives: PROPOFOL  Physician Time at Bedside: 25 minutes  Vitals: Vital signs were monitored during sedation. Cardiac Monitor, pulse oximeter Patient tolerance: Patient tolerated the procedure well with no immediate complications. He had slight drop in BP, but the BP responded swiftly to iv bolus. Comments: Pt with uneventful recovered. Returned to pre-procedural sedation baseline       (including critical care time) Labs Review Labs Reviewed  BASIC METABOLIC PANEL - Abnormal; Notable for the following:    Glucose, Bld  114 (*)    GFR calc non Af Amer 56 (*)    All other components within normal limits  CBC WITH DIFFERENTIAL/PLATELET  MAGNESIUM  I-STAT TROPOININ, ED    Imaging Review No results found. I have personally reviewed and evaluated these images and lab results as part of my medical decision-making.   EKG Interpretation   Date/Time:  Monday October 24 2014 13:14:10 EDT Ventricular Rate:  128 PR Interval:    QRS Duration: 82 QT Interval:  330 QTC Calculation: 481 R Axis:   -15 Text Interpretation:  Accelerated Junctional rhythm Abnormal ECG no acute  changes Confirmed by Kathrynn Humble, MD, Lariyah Shetterly [phone]) on 10/24/2014 3:15:42 PM     EKG Interpretation  Date/Time:  Monday October 24 2014 19:49:49 EDT Ventricular Rate:  89 PR Interval:  175 QRS Duration: 86 QT Interval:  411 QTC Calculation: 500 R Axis:   8 Text Interpretation:  Sinus rhythm Multiple premature complexes, vent  Borderline low voltage, extremity leads Abnormal R-wave progression, early transition conversion to sinus post electric cardioversion Confirmed by Kathrynn Humble, MD, Thelma Comp ([id number]) on 10/24/2014 8:55:20 PM          MDM   Final diagnoses:  Paroxysmal atrial fibrillation    Pt comes in with dizziness. He has hx of afib, paroxysmal, and is on atenolol - and is noted to be in afib.  Pt is a candidate for cardioversion. We had a detailed  conversation going over the alternative options available, the pros and cons of each, and the risk of chemical and electrical cardioversion, and the benefit of cardioversion immediately -  and patient and wife agreed to go with cardioversion. They wanted to discuss case with Dr. Claiborne Billings, their Cardiologist, who graciously stopped by and spoke with the patient.  Pt elected to with electrical cardioversion, and we successfully cardioverted him. Pt observed in the ER for another hour, and did well. Discharged with eliquis as per Dr. Evette Georges recommendation, and advised to see the afib  clinic.    Varney Biles, MD 10/26/14 [phone]

## 2014-10-24 NOTE — Telephone Encounter (Signed)
Spoke to [place]. (wife of patient).  States onset of AFib - patient has gone several years w/o problems.  He is no longer anticoagulated, she states low BP (80/53), dizzy on standing, unassessed HR. States HR is "just fast", can't count it. Patient taking 25mg  atenolol daily. I did not recommend extra BB at this time d/t BP status.  I informed her I would call AFib clinic, determine best advice. Spoke to [place]. She agreed w/ my concern that if HR too fast (generally above 130) and w/ problem of low BP, ED visit would be more appropriate d/t limitation of meds they can give.  I returned call to wife of patient. She again reiterated inability to count accurate HR. I recommended ED visit, she was appreciative of recommendation and prompt return call. Will route to Dr. Claiborne Billings for his awareness.

## 2014-10-24 NOTE — Sedation Documentation (Signed)
100 J, shock to sinus rhythm

## 2014-10-24 NOTE — ED Notes (Signed)
Pt c/o palpitations today with hypotension; pt sts hx of afib and palpitations this am; pt was taken off coumadin; pt denies CP or SOB

## 2014-10-24 NOTE — Discharge Instructions (Signed)
Information on my medicine - ELIQUIS (apixaban)  This medication education was reviewed with me or my healthcare representative as part of my discharge preparation.  The pharmacist that spoke with me during my hospital stay was:  Darl Pikes, [facility]  Why was Eliquis prescribed for you? Eliquis was prescribed for you to reduce the risk of a blood clot forming that can cause a stroke if you have a medical condition called atrial fibrillation (a type of irregular heartbeat).  What do You need to know about Eliquis ? Take your Eliquis TWICE DAILY - one tablet in the morning and one tablet in the evening with or without food. If you have difficulty swallowing the tablet whole please discuss with your pharmacist how to take the medication safely.  Take Eliquis exactly as prescribed by your doctor and DO NOT stop taking Eliquis without talking to the doctor who prescribed the medication.  Stopping may increase your risk of developing a stroke.  Refill your prescription before you run out.  After discharge, you should have regular check-up appointments with your healthcare provider that is prescribing your Eliquis.  In the future your dose may need to be changed if your kidney function or weight changes by a significant amount or as you get older.  What do you do if you miss a dose? If you miss a dose, take it as soon as you remember on the same day and resume taking twice daily.  Do not take more than one dose of ELIQUIS at the same time to make up a missed dose.  Important Safety Information A possible side effect of Eliquis is bleeding. You should call your healthcare provider right away if you experience any of the following: ? Bleeding from an injury or your nose that does not stop. ? Unusual colored urine (red or dark brown) or unusual colored stools (red or black). ? Unusual bruising for unknown reasons. ? A serious fall or if you hit your head (even if there is no bleeding).  Some  medicines may interact with Eliquis and might increase your risk of bleeding or clotting while on Eliquis. To help avoid this, consult your healthcare provider or pharmacist prior to using any new prescription or non-prescription medications, including herbals, vitamins, non-steroidal anti-inflammatory drugs (NSAIDs) and supplements.  This website has more information on Eliquis (apixaban): http://www.eliquis.com/eliquis/home   Atrial Fibrillation Atrial fibrillation is a type of irregular heart rhythm (arrhythmia). During atrial fibrillation, the upper chambers of the heart (atria) quiver continuously in a chaotic pattern. This causes an irregular and often rapid heart rate.  Atrial fibrillation is the result of the heart becoming overloaded with disorganized signals that tell it to beat. These signals are normally released one at a time by a part of the right atrium called the sinoatrial node. They then travel from the atria to the lower chambers of the heart (ventricles), causing the atria and ventricles to contract and pump blood as they pass. In atrial fibrillation, parts of the atria outside of the sinoatrial node also release these signals. This results in two problems. First, the atria receive so many signals that they do not have time to fully contract. Second, the ventricles, which can only receive one signal at a time, beat irregularly and out of rhythm with the atria.  There are three types of atrial fibrillation:   Paroxysmal. Paroxysmal atrial fibrillation starts suddenly and stops on its own within a week.  Persistent. Persistent atrial fibrillation lasts for more than  a week. It may stop on its own or with treatment.  Permanent. Permanent atrial fibrillation does not go away. Episodes of atrial fibrillation may lead to permanent atrial fibrillation. Atrial fibrillation can prevent your heart from pumping blood normally. It increases your risk of stroke and can lead to heart failure.   CAUSES   Heart conditions, including a heart attack, heart failure, coronary artery disease, and heart valve conditions.   Inflammation of the sac that surrounds the heart (pericarditis).  Blockage of an artery in the lungs (pulmonary embolism).  Pneumonia or other infections.  Chronic lung disease.  Thyroid problems, especially if the thyroid is overactive (hyperthyroidism).  Caffeine, excessive alcohol use, and use of some illegal drugs.   Use of some medicines, including certain decongestants and diet pills.  Heart surgery.   Birth defects.  Sometimes, no cause can be found. When this happens, the atrial fibrillation is called lone atrial fibrillation. The risk of complications from atrial fibrillation increases if you have lone atrial fibrillation and you are age 78 years or older. RISK FACTORS  Heart failure.  Coronary artery disease.  Diabetes mellitus.   High blood pressure (hypertension).   Obesity.   Other arrhythmias.   Increased age. SIGNS AND SYMPTOMS   A feeling that your heart is beating rapidly or irregularly.   A feeling of discomfort or pain in your chest.   Shortness of breath.   Sudden light-headedness or weakness.   Getting tired easily when exercising.   Urinating more often than normal (mainly when atrial fibrillation first begins).  In paroxysmal atrial fibrillation, symptoms may start and suddenly stop. DIAGNOSIS  Your health care provider may be able to detect atrial fibrillation when taking your pulse. Your health care provider may have you take a test called an ambulatory electrocardiogram (ECG). An ECG records your heartbeat patterns over a 24-hour period. You may also have other tests, such as:  Transthoracic echocardiogram (TTE). During echocardiography, sound waves are used to evaluate how blood flows through your heart.  Transesophageal echocardiogram (TEE).  Stress test. There is more than one type of stress  test. If a stress test is needed, ask your health care provider about which type is best for you.  Chest X-ray exam.  Blood tests.  Computed tomography (CT). TREATMENT  Treatment may include:  Treating any underlying conditions. For example, if you have an overactive thyroid, treating the condition may correct atrial fibrillation.  Taking medicine. Medicines may be given to control a rapid heart rate or to prevent blood clots, heart failure, or a stroke.  Having a procedure to correct the rhythm of the heart:  Electrical cardioversion. During electrical cardioversion, a controlled, low-energy shock is delivered to the heart through your skin. If you have chest pain, very low blood pressure, or sudden heart failure, this procedure may need to be done as an emergency.  Catheter ablation. During this procedure, heart tissues that send the signals that cause atrial fibrillation are destroyed.  Surgical ablation. During this surgery, thin lines of heart tissue that carry the abnormal signals are destroyed. This procedure can either be an open-heart surgery or a minimally invasive surgery. With the minimally invasive surgery, small cuts are made to access the heart instead of a large opening.  Pulmonary venous isolation. During this surgery, tissue around the veins that carry blood from the lungs (pulmonary veins) is destroyed. This tissue is thought to carry the abnormal signals. HOME CARE INSTRUCTIONS   Take medicines only as directed  by your health care provider. Some medicines can make atrial fibrillation worse or recur.  If blood thinners were prescribed by your health care provider, take them exactly as directed. Too much blood-thinning medicine can cause bleeding. If you take too little, you will not have the needed protection against stroke and other problems.  Perform blood tests at home if directed by your health care provider. Perform blood tests exactly as directed.  Quit smoking  if you smoke.  Do not drink alcohol.  Do not drink caffeinated beverages such as coffee, soda, and some teas. You may drink decaffeinated coffee, soda, or tea.   Maintain a healthy weight.Do not use diet pills unless your health care provider approves. They may make heart problems worse.   Follow diet instructions as directed by your health care provider.  Exercise regularly as directed by your health care provider.  Keep all follow-up visits as directed by your health care provider. This is important. PREVENTION  The following substances can cause atrial fibrillation to recur:   Caffeinated beverages.  Alcohol.  Certain medicines, especially those used for breathing problems.  Certain herbs and herbal medicines, such as those containing ephedra or ginseng.  Illegal drugs, such as cocaine and amphetamines. Sometimes medicines are given to prevent atrial fibrillation from recurring. Proper treatment of any underlying condition is also important in helping prevent recurrence.  SEEK MEDICAL CARE IF:  You notice a change in the rate, rhythm, or strength of your heartbeat.  You suddenly begin urinating more frequently.  You tire more easily when exerting yourself or exercising. SEEK IMMEDIATE MEDICAL CARE IF:   You have chest pain, abdominal pain, sweating, or weakness.  You feel nauseous.  You have shortness of breath.  You suddenly have swollen feet and ankles.  You feel dizzy.  Your face or limbs feel numb or weak.  You have a change in your vision or speech. MAKE SURE YOU:   Understand these instructions.  Will watch your condition.  Will get help right away if you are not doing well or get worse. Document Released: 02/11/2005 Document Revised: 06/28/2013 Document Reviewed: 03/24/2012 [facility] Patient Information 2015 [place], [place]. This information is not intended to replace advice given to you by your health care provider. Make sure you discuss any  questions you have with your health care provider.

## 2014-10-24 NOTE — Telephone Encounter (Signed)
agree

## 2014-10-24 NOTE — ED Notes (Signed)
Pt states he has been in Afib twice before, he was cardioverted the first and came out with meds the second.

## 2014-10-24 NOTE — Care Management (Signed)
Patient presented to [facility] ED with palpitations hx of A-Fib was on coumadin in the past . Patient will be started on Eliquis upon discharge. CM met with patient and wife at bedside, provided the free 30 day trial card, explained to patient and wife this would provide 30 days of the medication free. Instructed patient  to give the card along with prescription for Eliquis to the pharmacist. Patient verbalized understanding. No further questions or concerns voiced.

## 2014-10-24 NOTE — Telephone Encounter (Signed)
Patient is in A Fib---please call ASAP.

## 2014-10-26 ENCOUNTER — Ambulatory Visit (HOSPITAL_COMMUNITY)
Admission: RE | Admit: 2014-10-26 | Discharge: 2014-10-26 | Disposition: A | Discharge status: Home / Self Care | Payer: Medicare Other | Source: Ambulatory Visit | Attending: Nurse Practitioner | Admitting: Nurse Practitioner

## 2014-10-26 VITALS — BP 110/70 | HR 62 | Ht 73.0 in | Wt 180.0 lb

## 2014-10-26 DIAGNOSIS — I480 Paroxysmal atrial fibrillation: Secondary | ICD-10-CM | POA: Insufficient documentation

## 2014-10-26 MED ORDER — APIXABAN 5 MG PO TABS: 5.0000 mg | ORAL_TABLET | Freq: Two times a day (BID) | ORAL | Status: DC

## 2014-10-26 NOTE — Patient Instructions (Signed)
Your physician has recommended you make the following change in your medication:  1)Stop aspirin  

## 2014-10-27 ENCOUNTER — Encounter (HOSPITAL_COMMUNITY): Payer: Self-pay | Admitting: Nurse Practitioner

## 2014-10-27 NOTE — Progress Notes (Signed)
Patient ID: Gabriel Schmidt, male   DOB: 1934/06/13, 80 y.o.   MRN: [id number]     Primary Care Physician:  Gabriel Crutch, MD Referring Physician: [facility] ER Cardiologist: Dr. Alonna Buckler [name] is a 80 y.o. male with a h/o infrequent episodes of afib, '06,'08 and a recent ER visit 8/29 for PAF with RVR and hypotension with a systolic BP around 80. He was successfully cardioverted and is being seen in the afib clinic for f/u. He was given 2 weeks of Eliquis and does have a chadsvasc score of 2(age). He is in SR today and has not had any further issues. He does exercise regularly and is of normal weight. He does drink alcohol, moderate caffeine and we did discuss alcohol being a trigger for afib. He had been on Rythmol but because of lack of afib episodes it was discontinued when he saw Dr. Claiborne Billings in  February 2016. His wife states he does not snore.  Today, he denies symptoms of palpitations, chest pain, shortness of breath, orthopnea, PND, lower extremity edema, dizziness, presyncope, syncope, or neurologic sequela. The patient is tolerating medications without difficulties and is otherwise without complaint today.   Past Medical History  Diagnosis Date  . Hyperlipidemia   . Paroxysmal a-fib   . Mitral valve prolapse   . Fatigue    Past Surgical History  Procedure Laterality Date  . Tonsillectomy and adenoidectomy    . Cardioversion  04/14/2006  . Cardiac catheterization  06/19/2004    Current Outpatient Prescriptions  Medication Sig Dispense Refill  . apixaban (ELIQUIS) 5 MG TABS tablet Take 1 tablet (5 mg total) by mouth 2 (two) times daily. 30 tablet 3  . atenolol (TENORMIN) 25 MG tablet Take 25 mg by mouth daily.    . CRESTOR 10 MG tablet Take 1 tablet by mouth  daily 90 tablet 3  . ranitidine (ZANTAC) 150 MG capsule Take 150 mg by mouth 2 (two) times daily as needed for heartburn.     No current facility-administered medications for this encounter.    Allergies  Allergen  Reactions  . Penicillins     Social History   Social History  . Marital Status: Married    Spouse Name: N/A  . Number of Children: N/A  . Years of Education: N/A   Occupational History  . Not on file.   Social History Main Topics  . Smoking status: Never Smoker   . Smokeless tobacco: Never Used  . Alcohol Use: 1.8 - 2.4 oz/week    3-4 Standard drinks or equivalent per week  . Drug Use: No  . Sexual Activity: Not on file   Other Topics Concern  . Not on file   Social History Narrative    Family History  Problem Relation Age of Onset  . Stroke Father     ROS- All systems are reviewed and negative except as per the HPI above  Physical Exam: Filed Vitals:   10/26/14 1355  BP: 110/70  Pulse: 62  Height: 6\' 1"  (1.854 m)  Weight: 180 lb (81.647 kg)    GEN- The patient is well appearing, alert and oriented x 3 today.   Head- normocephalic, atraumatic Eyes-  Sclera clear, conjunctiva pink Ears- hearing intact Oropharynx- clear Neck- supple, no JVP Lymph- no cervical lymphadenopathy Lungs- Clear to ausculation bilaterally, normal work of breathing Heart- Regular rate and rhythm, no murmurs, rubs or gallops, PMI not laterally displaced GI- soft, NT, ND, + BS Extremities- no clubbing,  cyanosis, or edema MS- no significant deformity or atrophy Skin- no rash or lesion Psych- euthymic mood, full affect Neuro- strength and sensation are intact  EKG- NSR, normal EKG.  Assessment and Plan: 1. PAF Infrequent episodes, successfully cardioverted I do not feel he needs a daily med at this point, especially with a soft BP He is difficult to treat with meds when he has breakthrough being unstable with RVR and hypotension  2. Chadsvasc score of 2 He was given 2 weeks of eliquis 5 mg bid from ER and I will give an additional 30 day free coupon to use.  By guidelines, he should continue on anticoagulant, but he does not want to stay on blood thinner indefinitely Stop  ASA Will set up with PA when Dr. Claiborne Billings is in the office to further discuss in 4-6 weeks.  Gabriel Schmidt, [facility] [address] [place], [place] [address] [phone]

## 2014-11-03 ENCOUNTER — Telehealth: Payer: Self-pay | Admitting: Physician Assistant

## 2014-11-03 ENCOUNTER — Encounter: Payer: Self-pay | Admitting: Physician Assistant

## 2014-11-03 ENCOUNTER — Other Ambulatory Visit (HOSPITAL_COMMUNITY): Payer: Self-pay | Admitting: *Deleted

## 2014-11-03 ENCOUNTER — Telehealth: Payer: Self-pay | Admitting: Cardiovascular Disease

## 2014-11-03 MED ORDER — APIXABAN 5 MG PO TABS: 5.0000 mg | ORAL_TABLET | Freq: Two times a day (BID) | ORAL | Status: DC

## 2014-11-03 NOTE — Telephone Encounter (Signed)
Spoke to patient. He explains he is currently getting prior authorization and other things in the works to get initial 30 day supply of Eliquis - explains he got the first 10 days from pharmacy after initial ED visit and this was renewed for another 20 day supply by Butch Penny at A Fib clinic. He explains he does not need 30 day Rx yet, was calling to see if samples available. Not currently in stock. Requested he call pharmacy, see if anything further required, give [place] a call if we can help further or call when he needs 30 day supply prescribed. Pt voiced understanding.

## 2014-11-03 NOTE — Telephone Encounter (Signed)
Please call,concerning his Eliquis prescription.

## 2014-11-03 NOTE — Telephone Encounter (Signed)
PA for eliquis approved through 11/03/2015

## 2014-11-04 NOTE — Telephone Encounter (Signed)
Close encounter 

## 2014-11-30 ENCOUNTER — Encounter: Payer: Self-pay | Admitting: Physician Assistant

## 2014-11-30 ENCOUNTER — Ambulatory Visit: Payer: Medicare Other | Admitting: Physician Assistant

## 2014-11-30 ENCOUNTER — Ambulatory Visit (INDEPENDENT_AMBULATORY_CARE_PROVIDER_SITE_OTHER): Payer: Medicare Other | Admitting: Physician Assistant

## 2014-11-30 VITALS — BP 110/64 | HR 72 | Ht 74.0 in | Wt 181.1 lb

## 2014-11-30 DIAGNOSIS — I480 Paroxysmal atrial fibrillation: Secondary | ICD-10-CM

## 2014-11-30 NOTE — Patient Instructions (Signed)
Continue same medications    Your physician wants you to follow-up in: with Dr.Kelly in 05/2015. You will receive a reminder letter in the mail two months in advance. If you don't receive a letter, please call our office to schedule the follow-up appointment.

## 2014-11-30 NOTE — Progress Notes (Signed)
Patient ID: Gabriel Schmidt, male   DOB: [birth year], 80 y.o.   MRN: [id number]    Date:  11/30/2014   ID:  Gabriel Schmidt, DOB Oct 01, 1934, MRN [id number]  PCP:   Melinda Crutch, MD  Primary Cardiologist:  Claiborne Billings   Chief Complaint  Patient presents with  . Follow-up    No complaints of chest pain, SOB, edema or dizziness. No awareness of atrial fib.     History of Present Illness: Gabriel Schmidt is a 80 y.o. male  with a h/o infrequent episodes of afib, '06,'08 and a recent ER visit 8/29 for PAF with RVR and hypotension with a systolic BP around 80. He was successfully cardioverted and is being seen in the afib clinic for f/u. He was given 2 weeks of Eliquis and does have a chadsvasc score of 2(age). He is in SR today and has not had any further issues. He does exercise regularly and is of normal weight. He does drink alcohol, moderate caffeine and we did discuss alcohol being a trigger for afib. He had been on Rythmol but because of lack of afib episodes it was discontinued when he saw Dr. Claiborne Billings in February 2016. His wife states he does not snore.  The patient currently denies nausea, vomiting, fever, chest pain, shortness of breath, orthopnea, dizziness, PND, cough, congestion, abdominal pain, hematochezia, melena, lower extremity edema, claudication.  Wt Readings from Last 3 Encounters:  11/30/14 82.146 kg (181 lb 1.6 oz)  10/26/14 81.647 kg (180 lb)  07/19/14 81.647 kg (180 lb)     Past Medical History  Diagnosis Date  . Hyperlipidemia   . Paroxysmal a-fib ([place])   . Mitral valve prolapse   . Fatigue     Current Outpatient Prescriptions  Medication Sig Dispense Refill  . apixaban (ELIQUIS) 5 MG TABS tablet Take 1 tablet (5 mg total) by mouth 2 (two) times daily. 60 tablet 6  . atenolol (TENORMIN) 25 MG tablet Take 25 mg by mouth daily.    . CRESTOR 10 MG tablet Take 1 tablet by mouth  daily 90 tablet 3  . ranitidine (ZANTAC) 150 MG capsule Take 150 mg by mouth 2 (two) times daily as  needed for heartburn.     No current facility-administered medications for this visit.    Allergies:    Allergies  Allergen Reactions  . Penicillins     Social History:  The patient  reports that he has never smoked. He has never used smokeless tobacco. He reports that he drinks about 1.8 - 2.4 oz of alcohol per week. He reports that he does not use illicit drugs.   Family history:   Family History  Problem Relation Age of Onset  . Stroke Father     ROS:  Please see the history of present illness.  All other systems reviewed and negative.   PHYSICAL EXAM: VS:  BP 110/64 mmHg  Pulse 72  Ht 6\' 2"  (1.88 m)  Wt 82.146 kg (181 lb 1.6 oz)  BMI 23.24 kg/m2 Well nourished, well developed, in no acute distress HEENT: Pupils are equal round react to light accommodation extraocular movements are intact.  Neck: no JVDNo cervical lymphadenopathy. Cardiac: Regular rate and rhythm without murmurs rubs or gallops. Lungs:  clear to auscultation bilaterally, no wheezing, rhonchi or rales Ext: no lower extremity edema.  2+ radial and dorsalis pedis pulses. Skin: warm and dry Neuro:  Grossly normal  EKG:  Normal sinus rhythm rate 72 bpm   ASSESSMENT AND  PLAN:  Problem List Items Addressed This Visit    PAF (paroxysmal atrial fibrillation) (HCC) - Primary    Containing normal sinus rhythm rate 72 bpm. Continue on atenolol. Patient will continue a course for approximately a month and then discontinue it.  He knows exactly when he goes into atrial fibrillation each time.  If he does, he'll restart Elquis(stopping it is his choice, CHADSVASC 2) before going to the emergency room.  He generally is hypotensive when he is in it and thus will not tolerate taking short acting diltiazem to try and get him out of it.

## 2015-02-21 ENCOUNTER — Other Ambulatory Visit: Payer: Self-pay | Admitting: Cardiovascular Disease

## 2015-02-21 NOTE — Telephone Encounter (Signed)
Rx request sent to pharmacy.  

## 2015-03-01 ENCOUNTER — Telehealth: Payer: Self-pay | Admitting: Cardiovascular Disease

## 2015-03-01 ENCOUNTER — Emergency Department (HOSPITAL_COMMUNITY)
Admission: EM | Admit: 2015-03-01 | Discharge: 2015-03-01 | Disposition: A | Discharge status: Home / Self Care | Payer: Medicare Other | Attending: Emergency Medicine | Admitting: Emergency Medicine

## 2015-03-01 ENCOUNTER — Emergency Department (HOSPITAL_COMMUNITY): Payer: Medicare Other

## 2015-03-01 ENCOUNTER — Encounter (HOSPITAL_COMMUNITY): Payer: Self-pay | Admitting: Family Medicine

## 2015-03-01 DIAGNOSIS — Z9889 Other specified postprocedural states: Secondary | ICD-10-CM | POA: Diagnosis not present

## 2015-03-01 DIAGNOSIS — R002 Palpitations: Secondary | ICD-10-CM | POA: Diagnosis present

## 2015-03-01 DIAGNOSIS — E785 Hyperlipidemia, unspecified: Secondary | ICD-10-CM | POA: Diagnosis not present

## 2015-03-01 DIAGNOSIS — Z880 Allergy status to penicillin: Secondary | ICD-10-CM | POA: Insufficient documentation

## 2015-03-01 DIAGNOSIS — I480 Paroxysmal atrial fibrillation: Secondary | ICD-10-CM | POA: Diagnosis not present

## 2015-03-01 DIAGNOSIS — Z79899 Other long term (current) drug therapy: Secondary | ICD-10-CM | POA: Insufficient documentation

## 2015-03-01 DIAGNOSIS — Z7902 Long term (current) use of antithrombotics/antiplatelets: Secondary | ICD-10-CM | POA: Diagnosis not present

## 2015-03-01 LAB — CBC
HEMATOCRIT: 46.1 % (ref 39.0–52.0)
HEMOGLOBIN: 15.1 g/dL (ref 13.0–17.0)
MCH: 29.3 pg (ref 26.0–34.0)
MCHC: 32.8 g/dL (ref 30.0–36.0)
MCV: 89.5 fL (ref 78.0–100.0)
Platelets: 279 10*3/uL (ref 150–400)
RBC: 5.15 MIL/uL (ref 4.22–5.81)
RDW: 12.5 % (ref 11.5–15.5)
WBC: 6.4 10*3/uL (ref 4.0–10.5)

## 2015-03-01 LAB — BASIC METABOLIC PANEL
ANION GAP: 7 (ref 5–15)
BUN: 13 mg/dL (ref 6–20)
CALCIUM: 9.4 mg/dL (ref 8.9–10.3)
CO2: 30 mmol/L (ref 22–32)
Chloride: 105 mmol/L (ref 101–111)
Creatinine, Ser: 1.18 mg/dL (ref 0.61–1.24)
GFR calc Af Amer: >60 mL/min (ref >60)
GFR calc non Af Amer: 57 mL/min — ABNORMAL LOW (ref >60)
GLUCOSE: 99 mg/dL (ref 65–99)
Potassium: 4.7 mmol/L (ref 3.5–5.1)
Sodium: 142 mmol/L (ref 135–145)

## 2015-03-01 MED ORDER — PROPOFOL 10 MG/ML IV BOLUS: 1.0000 mg/kg | Freq: Once | INTRAVENOUS | Status: DC

## 2015-03-01 MED ORDER — PROPOFOL 10 MG/ML IV BOLUS
INTRAVENOUS | Status: AC | PRN
  Administered 2015-03-01: 40 mg via INTRAVENOUS

## 2015-03-01 MED ORDER — PROPOFOL 10 MG/ML IV BOLUS
INTRAVENOUS | Status: AC
  Filled 2015-03-01: qty 20

## 2015-03-01 MED ORDER — SODIUM CHLORIDE 0.9 % IV BOLUS (SEPSIS)
1000.0000 mL | Freq: Once | INTRAVENOUS | Status: AC
  Administered 2015-03-01: 1000 mL via INTRAVENOUS

## 2015-03-01 NOTE — Telephone Encounter (Signed)
Returned call to patient. He states lightheadedness this AM, checked HR and is "bouncing around".  He states this episode started around 9:30 during exercise. He felt lightheaded but continued to exercise. Checked rate when he got home. Had rate of 80, then 108. Feels more rapid. He had a similar episode back in August.  He took BP and it was ~90/50. Since pt was/has continued feeling lightheaded, I did not recommend extra atenolol*. He asked about using rhythmol. Reiterated concern I had was to not drop his BP w/ additional medications.   Explained that symptoms would be best addressed in ED. Pt was given instruction to present to [facility] ED for eval - wife will drive him. Acknowledged understanding of instructions.  *reviewed meds w/ patient. He takes his atenolol as 25mg  once daily in evening, not 12.5mg  BID as reported - he has not taken this medication today.

## 2015-03-01 NOTE — ED Notes (Signed)
Returned from xray

## 2015-03-01 NOTE — ED Notes (Signed)
Pt here for irregular HR that started this am. Denies any chest pain or SOB. Pt hypotensive at triage at 87/58.

## 2015-03-01 NOTE — ED Provider Notes (Signed)
CSN: [id number]     Arrival date & time 03/01/15  1325 History   First MD Initiated Contact with Patient 03/01/15 1343     Chief Complaint  Patient presents with  . Atrial Fibrillation     (Consider location/radiation/quality/duration/timing/severity/associated sxs/prior Treatment) Patient is a 81 y.o. male presenting with palpitations. The history is provided by the patient.  Palpitations Palpitations quality:  Fast Onset quality:  Insidious Duration:  1 day Timing:  Constant Progression:  Unchanged Chronicity:  Recurrent Relieved by:  Nothing Worsened by:  Nothing Associated symptoms: malaise/fatigue   Associated symptoms comment:  Lightheadedness Risk factors: hx of atrial fibrillation     Past Medical History  Diagnosis Date  . Hyperlipidemia   . Paroxysmal a-fib ([place])   . Mitral valve prolapse   . Fatigue    Past Surgical History  Procedure Laterality Date  . Tonsillectomy and adenoidectomy    . Cardioversion  04/14/2006  . Cardiac catheterization  06/19/2004   Family History  Problem Relation Age of Onset  . Stroke Father    Social History  Substance Use Topics  . Smoking status: Never Smoker   . Smokeless tobacco: Never Used  . Alcohol Use: 1.8 - 2.4 oz/week    3-4 Standard drinks or equivalent per week    Review of Systems  Constitutional: Positive for malaise/fatigue.  Cardiovascular: Positive for palpitations.  All other systems reviewed and are negative.     Allergies  Penicillins  Home Medications   Prior to Admission medications   Medication Sig Start Date End Date Taking? Authorizing Provider  apixaban (ELIQUIS) 5 MG TABS tablet Take 1 tablet (5 mg total) by mouth 2 (two) times daily. 11/03/14   Sherran Needs, NP  atenolol (TENORMIN) 25 MG tablet Take one-half tablet by  mouth two times daily 02/21/15   Troy Sine, MD  CRESTOR 10 MG tablet Take 1 tablet by mouth  daily 08/12/14   Troy Sine, MD  ranitidine (ZANTAC) 150 MG capsule  Take 150 mg by mouth 2 (two) times daily as needed for heartburn.    Historical Provider, MD   BP 87/58 mmHg  Pulse 120  Resp 18 Physical Exam  Constitutional: He is oriented to person, place, and time. He appears well-developed and well-nourished. He appears ill. No distress.  HENT:  Head: Normocephalic and atraumatic.  Mouth/Throat: Oropharynx is clear and moist.  Eyes: Conjunctivae are normal.  Neck: Neck supple. No tracheal deviation present.  Cardiovascular: Normal heart sounds.  An irregularly irregular rhythm present. Tachycardia present.   Pulmonary/Chest: Effort normal. No respiratory distress. He has no decreased breath sounds. He has no wheezes. He has no rhonchi.  Abdominal: Soft. He exhibits no distension.  Neurological: He is alert and oriented to person, place, and time.  Skin: Skin is warm and dry.  Psychiatric: He has a normal mood and affect.    ED Course  .Cardioversion Date/Time: 03/01/2015 4:02 PM Performed by: Leo Grosser Authorized by: Leo Grosser Consent: Verbal consent obtained. Risks and benefits: risks, benefits and alternatives were discussed Consent given by: patient Patient understanding: patient states understanding of the procedure being performed Patient consent: the patient's understanding of the procedure matches consent given Procedure consent: procedure consent matches procedure scheduled Relevant documents: relevant documents present and verified Test results: test results available and properly labeled Imaging studies: imaging studies available Required items: required blood products, implants, devices, and special equipment available Patient identity confirmed: verbally with patient, arm band, provided demographic data  and hospital-assigned identification number Time out: Immediately prior to procedure a "time out" was called to verify the correct patient, procedure, equipment, support staff and site/side marked as required. Patient  sedated: yes Sedation type: moderate (conscious) sedation Sedatives: propofol Vitals: Vital signs were monitored during sedation. Cardioversion basis: elective Indications comments: onset <24 hours Pre-procedure rhythm: atrial fibrillation Patient position: patient was placed in a supine position Chest area: chest area exposed Electrodes: pads Electrodes placed: anterior-posterior Number of attempts: 1 Attempt 1 mode: synchronous Attempt 1 waveform: biphasic Attempt 1 shock (in Joules): 150 Attempt 1 outcome: conversion to normal sinus rhythm Post-procedure rhythm: normal sinus rhythm Complications: no complications Patient tolerance: Patient tolerated the procedure well with no immediate complications   (including critical care time)  CRITICAL CARE Performed by: Leo Grosser Total critical care time: 30 minutes Critical care time was exclusive of separately billable procedures and treating other patients. Critical care was necessary to treat or prevent imminent or life-threatening deterioration. Critical care was time spent personally by me on the following activities: development of treatment plan with patient and/or surrogate as well as nursing, discussions with consultants, evaluation of patient's response to treatment, examination of patient, obtaining history from patient or surrogate, ordering and performing treatments and interventions, ordering and review of laboratory studies, ordering and review of radiographic studies, pulse oximetry and re-evaluation of patient's condition.  Procedural sedation Performed by: Leo Grosser Consent: Verbal consent obtained. Risks and benefits: risks, benefits and alternatives were discussed Required items: required blood products, implants, devices, and special equipment available Patient identity confirmed: arm band and provided demographic data Time out: Immediately prior to procedure a "time out" was called to verify the correct  patient, procedure, equipment, support staff and site/side marked as required.  Sedation type: moderate (conscious) sedation NPO time confirmed and considedered  Sedatives: PROPOFOL  Physician Time at Bedside: 20 minutes  Vitals: Vital signs were monitored during sedation. Cardiac Monitor, pulse oximeter Patient tolerance: Patient tolerated the procedure well with no immediate complications. Comments: Pt with uneventful recovered. Returned to pre-procedural sedation baseline    Labs Review Labs Reviewed  BASIC METABOLIC PANEL - Abnormal; Notable for the following:    GFR calc non Af Amer 57 (*)    All other components within normal limits  CBC    Imaging Review Dg Chest 2 View  03/01/2015  CLINICAL DATA:  Atrial fibrillation.  Lightheadedness. EXAM: CHEST  2 VIEW COMPARISON:  10/24/2014 FINDINGS: Cardiac pads on the chest. Lungs are clear without airspace disease or pulmonary edema. Nodular density in the left lower chest probably represents a cardiac lead sticker. Heart size is normal. Trachea is midline. No large pleural effusions. IMPRESSION: No acute chest findings. Nodular density in left lower chest is probably related to ECG sticker. Recommend follow-up chest radiograph once cardiac leads and pads can be removed. Electronically Signed   By: Markus Daft M.D.   On: 03/01/2015 14:37   I have personally reviewed and evaluated these images and lab results as part of my medical decision-making.   EKG Interpretation   Date/Time:  Wednesday March 01 2015 15:45:54 EST Ventricular Rate:  65 PR Interval:  179 QRS Duration: 94 QT Interval:  411 QTC Calculation: 427 R Axis:   6 Text Interpretation:  Sinus rhythm Atrial premature complexes in couplets  Abnormal R-wave progression, early transition Baseline wander in lead(s)  V1 ED PHYSICIAN INTERPRETATION AVAILABLE IN [facility] Confirmed by  TEST, Record ([id number]) on 03/02/2015 6:37:17 AM  MDM   Final diagnoses:  PAF  (paroxysmal atrial fibrillation) ([place])    81 y.o. male presents with recurrent atrial fibrillation with rapid ventricular rate. He has a rate of 100-120 and a variable blood pressure but no mental status changes or other indication for emergent cardioversion. Yesterday he was completely asymptomatic. He was instructed by his cardiologist to take a dose of eliquis after the onset of symptoms and did so this morning. I discussed cardioversion with the on-call cardiologist due to recent onset who agreed with the procedure. Patient was monitored closely on telemetry, sedated, cardioverted a single time with conversion to sinus rhythm but continued to have occasional PACs. Recommended continuing eliquis and close follow-up with his primary cardiologist, Dr. Claiborne Billings.   Leo Grosser, MD 03/02/15 [phone]

## 2015-03-01 NOTE — Telephone Encounter (Signed)
Pt is calling in stating that he is having some heart rhythm issues and lightheadedness. This has been happening since this morning.  Thanks

## 2015-03-01 NOTE — Discharge Instructions (Signed)
Electrical Cardioversion °Electrical cardioversion is the delivery of a jolt of electricity to change the rhythm of the heart. Sticky patches or metal paddles are placed on the chest to deliver the electricity from a device. This is done to restore a normal rhythm. A rhythm that is too fast or not regular keeps the heart from pumping well. °Electrical cardioversion is done in an emergency if:  °· There is low or no blood pressure as a result of the heart rhythm.   °· Normal rhythm must be restored as fast as possible to protect the brain and heart from further damage.   °· It may save a life. °Cardioversion may be done for heart rhythms that are not immediately life threatening, such as atrial fibrillation or flutter, in which:  °· The heart is beating too fast or is not regular.   °· Medicine to change the rhythm has not worked.   °· It is safe to wait in order to allow time for preparation. °· Symptoms of the abnormal rhythm are bothersome. °· The risk of stroke and other serious problems can be reduced. °LET YOUR HEALTH CARE PROVIDER KNOW ABOUT:  °· Any allergies you have. °· All medicines you are taking, including vitamins, herbs, eye drops, creams, and over-the-counter medicines. °· Previous problems you or members of your family have had with the use of anesthetics.   °· Any blood disorders you have.   °· Previous surgeries you have had.   °· Medical conditions you have. °RISKS AND COMPLICATIONS  °Generally, this is a safe procedure. However, problems can occur and include:  °· Breathing problems related to the anesthetic used. °· A blood clot that breaks free and travels to other parts of your body. This could cause a stroke or other problems. The risk of this is lowered by use of blood-thinning medicine (anticoagulant) prior to the procedure. °· Cardiac arrest (rare). °BEFORE THE PROCEDURE  °· You may have tests to detect blood clots in your heart and to evaluate heart function.  °· You may start taking  anticoagulants so your blood does not clot as easily.   °· Medicines may be given to help stabilize your heart rate and rhythm. °PROCEDURE °· You will be given medicine through an IV tube to reduce discomfort and make you sleepy (sedative).   °· An electrical shock will be delivered. °AFTER THE PROCEDURE °Your heart rhythm will be watched to make sure it does not change.  °  °This information is not intended to replace advice given to you by your health care provider. Make sure you discuss any questions you have with your health care provider. °  °Document Released: 02/01/2002 Document Revised: 03/04/2014 Document Reviewed: 08/26/2012 °Elsevier Interactive Patient Education ©2016 [employer] ° °

## 2015-03-01 NOTE — ED Notes (Signed)
Pt tolerated procedure well. Pt was shocked at 150j by Dr. Laneta Simmers. EKG repeated. Will continue to monitor.

## 2015-03-01 NOTE — ED Notes (Signed)
Patient transported to X-ray 

## 2015-03-03 ENCOUNTER — Ambulatory Visit (INDEPENDENT_AMBULATORY_CARE_PROVIDER_SITE_OTHER): Payer: Medicare Other | Admitting: Cardiovascular Disease

## 2015-03-03 VITALS — BP 106/62 | HR 51 | Ht 73.5 in | Wt 179.7 lb

## 2015-03-03 DIAGNOSIS — I340 Nonrheumatic mitral (valve) insufficiency: Secondary | ICD-10-CM

## 2015-03-03 DIAGNOSIS — I341 Nonrheumatic mitral (valve) prolapse: Secondary | ICD-10-CM

## 2015-03-03 DIAGNOSIS — I480 Paroxysmal atrial fibrillation: Secondary | ICD-10-CM

## 2015-03-03 DIAGNOSIS — E785 Hyperlipidemia, unspecified: Secondary | ICD-10-CM

## 2015-03-03 MED ORDER — ATENOLOL 25 MG PO TABS: 12.5000 mg | ORAL_TABLET | Freq: Every day | ORAL | Status: DC

## 2015-03-03 MED ORDER — FLECAINIDE ACETATE 50 MG PO TABS: 50.0000 mg | ORAL_TABLET | Freq: Two times a day (BID) | ORAL | Status: DC

## 2015-03-03 NOTE — Patient Instructions (Signed)
Your physician has requested that you have an echocardiogram. Echocardiography is a painless test that uses sound waves to create images of your heart. It provides your doctor with information about the size and shape of your heart and how well your heart's chambers and valves are working. This procedure takes approximately one hour. There are no restrictions for this procedure.  You have been referred to Roderic Palau with our a-fib clinic.  Your physician has recommended you make the following change in your medication:   1.) the atenolol has been decreased to 1/2 tablet daily. ( 12.5 mg)  2.) start new prescription for generic tambocor. This has been sent to your pharmacy.  Your physician recommends that you schedule a follow-up appointment in: 2 months with Dr Claiborne Billings.

## 2015-03-04 ENCOUNTER — Encounter: Payer: Self-pay | Admitting: Cardiovascular Disease

## 2015-03-04 NOTE — Progress Notes (Signed)
Patient ID: Gabriel Schmidt, male   DOB: 11-16-1934, 81 y.o.   MRN: [id number]     HPI: Gabriel Schmidt is a 81 y.o. male who presents to the office today for an 8 month follow up cardiology evaluation.  Gabriel Schmidt has a history of PAF with 2 previously documented episodes in 2006 and in February 2008 prior to the past 6 months  He had been on combination therapy with Rythmol as well as low-dose beta blocker therapy.  He had been maintained on Coumadin and this was ultimately discontinued in September 2011.  He also has a history of hyperlipidemia for which she's been treated with Crestor 10 mg, mitral valve prolapse with mild MR, and remotely had noted some fatigability.  I last saw him in March 2014 at which time he was doing well on current therapy.  He apparently had seen Gabriel Schmidt in June 2015 and at that time expressed to her his wishes to discontinue Rythmol if possible.  She did not feel comfortable making that decision.  When I  saw him in February 2017, I recommended that he try to wean off Rythmol which he had done over a three-week period.  Since that time, he has been on atenolol 25 mg daily in addition to aspirin 81 mg, omega-3 fatty acids, and Crestor 10 mg.  He is unaware of any breakthrough rhythm disturbance.  He continues to remain stable.  In the past the episodes of paroxysmal atrial fibrillation occurred in the setting of more significant alcohol intake.  When I last saw him in May, he was feeling great and was  exercising regularly.  He works out 5 days per week.  He has remained in good physical shape.  He denies any weight gain.  He denies chest pain.  He denies shortness of breath.  He denies bleeding.  Since I saw him, he apparently has had 2 recurrent episodes of atrial fibrillation.  The first was in 10/24/2014 when he developed atrial fibrillation while working out.  He presented to the emergency room.  He was given eloquence and successfully cardioverted that day back to  sinus rhythm.  He went to the atrial fibrillation clinic in follow-up.  He had a chads fast score of 2.  He was felt that he did not require daily medication at that point, particularly with a somewhat low blood pressure.  When he had developed atrial fibrillation with a rapid ventricular response.  He was hypotensive.  He apparently had done well but again on 03/02/2015 presented to the emergency room with recurrent onset of atrial fibrillation.  Eliquis was reinstituted since he had only taken it for a month after his August cardioversion and he again was successfully cardioverted back to sinus rhythm.  He presents for evaluation.  Past Medical History  Diagnosis Date  . Hyperlipidemia   . Paroxysmal a-fib ([place])   . Mitral valve prolapse   . Fatigue     Past Surgical History  Procedure Laterality Date  . Tonsillectomy and adenoidectomy    . Cardioversion  04/14/2006  . Cardiac catheterization  06/19/2004    Allergies  Allergen Reactions  . Penicillins Rash    Has patient had a PCN reaction causing immediate rash, facial/tongue/throat swelling, SOB or lightheadedness with hypotension: No Has patient had a PCN reaction causing severe rash involving mucus membranes or skin necrosis: No Has patient had a PCN reaction that required hospitalization No Has patient had a PCN reaction occurring within the last  10 years: No If all of the above answers are "NO", then may proceed with Cephalosporin use.    Current Outpatient Prescriptions  Medication Sig Dispense Refill  . apixaban (ELIQUIS) 5 MG TABS tablet Take 1 tablet (5 mg total) by mouth 2 (two) times daily. (Patient taking differently: Take 5 mg by mouth 2 (two) times daily as needed (afib). ) 60 tablet 6  . aspirin EC 81 MG tablet Take 81 mg by mouth daily.    Marland Kitchen atenolol (TENORMIN) 25 MG tablet Take 0.5 tablets (12.5 mg total) by mouth daily. 90 tablet 1  . calcium carbonate (TUMS - DOSED IN MG ELEMENTAL CALCIUM) 500 MG chewable tablet  Chew 1 tablet by mouth 2 (two) times daily as needed for indigestion or heartburn.    . CRESTOR 10 MG tablet Take 1 tablet by mouth  daily (Patient taking differently: Take 10 mg by mouth daily at bedtime.) 90 tablet 3  . Pseudoephedrine-APAP-DM (DAYQUIL MULTI-SYMPTOM COLD/FLU PO) Take 1 capsule by mouth daily as needed (cough, cold).    . flecainide (TAMBOCOR) 50 MG tablet Take 1 tablet (50 mg total) by mouth 2 (two) times daily. 60 tablet 6   No current facility-administered medications for this visit.    Social History   Social History  . Marital Status: Married    Spouse Name: N/A  . Number of Children: N/A  . Years of Education: N/A   Occupational History  . Not on file.   Social History Main Topics  . Smoking status: Never Smoker   . Smokeless tobacco: Never Used  . Alcohol Use: 1.8 - 2.4 oz/week    3-4 Standard drinks or equivalent per week  . Drug Use: No  . Sexual Activity: Not on file   Other Topics Concern  . Not on file   Social History Narrative   Socially he is retired.  He has 3 children.  He previously was a [employer].  Family history is notable that both parents are deceased.  Father died of a stroke.  ROS General: Negative; No fevers, chills, or night sweats HEENT: Negative; No changes in vision or hearing, sinus congestion, difficulty swallowing Pulmonary: Negative; No cough, wheezing, shortness of breath, hemoptysis Cardiovascular: See HPI: No chest pain, presyncope, syncope, palpatations GI: Negative; No nausea, vomiting, diarrhea, or abdominal pain GU: Negative; No dysuria, hematuria, or difficulty voiding Musculoskeletal: Negative; no myalgias, joint pain, or weakness Hematologic: Negative; no easy bruising, bleeding Endocrine: Negative; no heat/cold intolerance; no diabetes, Neuro: Negative; no changes in balance, headaches Skin: Negative; No rashes or skin lesions Psychiatric: Negative; No behavioral problems, depression Sleep:  Negative; No snoring,  daytime sleepiness, hypersomnolence, bruxism, restless legs, hypnogognic hallucinations. Other comprehensive 14 point system review is negative   Physical Exam BP 106/62 mmHg  Pulse 51  Ht 6' 1.5" (1.867 m)  Wt 179 lb 11.2 oz (81.511 kg)  BMI 23.38 kg/m2   Wt Readings from Last 3 Encounters:  03/03/15 179 lb 11.2 oz (81.511 kg)  11/30/14 181 lb 1.6 oz (82.146 kg)  10/26/14 180 lb (81.647 kg)   General: Alert, oriented, no distress.  Skin: normal turgor, no rashes, warm and dry HEENT: Normocephalic, atraumatic. Pupils equal round and reactive to light; sclera anicteric; extraocular muscles intact, No lid lag; Nose without nasal septal hypertrophy; Mouth/Parynx benign; Mallinpatti scale 2 Neck: No JVD, no carotid bruits; normal carotid upstroke Lungs: clear to ausculatation and percussion bilaterally; no wheezing or rales, normal inspiratory and expiratory effort Chest wall: without  tenderness to palpitation Heart: PMI not displaced, RRR, s1 s2 normal, 1/6 systolic murmur, No diastolic murmur, no rubs, gallops, thrills, or heaves Abdomen: soft, nontender; no hepatosplenomehaly, BS+; abdominal aorta nontender and not dilated by palpation. Back: no CVA tenderness Pulses: 2+  Musculoskeletal: full range of motion, normal strength, no joint deformities Extremities: Pulses 2+, no clubbing cyanosis or edema, Homan's sign negative  Neurologic: grossly nonfocal; Cranial nerves grossly wnl Psychologic: Normal mood and affect  ECG (independently read by me): Sinus bradycardia at 51 bpm.  PR interval 180 ms; QTc interval 41 ms.  May 2016 ECG (independently read by me): Sinus bradycardia 59 bpm.  QTc interval 423 ms.  PR interval 188 ms  February 2016 ECG (independently read by me): Sinus bradycardia 59 bpm.  Borderline first-degree AV block with a PR interval of 24 ms.  No significant ST segment changes.  LABS:  BMP Latest Ref Rng 03/01/2015 10/24/2014 07/13/2014    Glucose 65 - 99 mg/dL 99 114(H) 92  BUN 6 - 20 mg/dL _0 Creatinine 0.61 - 1.24 mg/dL 1.18 1.21 0.98  BUN/Creat Ratio 10 - 22 - - 14  Sodium 135 - 145 mmol/L 142 140 142  Potassium 3.5 - 5.1 mmol/L 4.7 4.2 5.0  Chloride 101 - 111 mmol/L 105 106 103  CO2 22 - 32 mmol/L _1 Calcium 8.9 - 10.3 mg/dL 9.4 9.1 9.1     Hepatic Function Latest Ref Rng 07/13/2014 08/02/2013  Total Protein 6.0 - 8.5 g/dL 5.9(L) 6.1  Albumin 3.5 - 4.8 g/dL 4.0 4.1  AST 0 - 40 IU/L 20 15  ALT 0 - 44 IU/L 11 10  Alk Phosphatase 39 - 117 IU/L 47 41  Total Bilirubin 0.0 - 1.2 mg/dL 1.0 1.4(H)     CBC Latest Ref Rng 03/01/2015 10/24/2014 07/13/2014  WBC 4.0 - 10.5 K/uL 6.4 4.9 4.8  Hemoglobin 13.0 - 17.0 g/dL 15.1 15.2 -  Hematocrit 39.0 - 52.0 % 46.1 45.7 43.9  Platelets 150 - 400 K/uL 279 260 263     BNP No results found for: BNP  ProBNP No results found for: PROBNP   Lipid Panel     Component Value Date/Time   CHOL 141 07/13/2014 0923   CHOL 154 08/02/2013 0835   TRIG 78 07/13/2014 0923   HDL 64 07/13/2014 0923   HDL 57 08/02/2013 0835   CHOLHDL 2.7 08/02/2013 0835   VLDL 17 08/02/2013 0835   LDLCALC 61 07/13/2014 0923   LDLCALC 80 08/02/2013 0835     RADIOLOGY: Dg Chest 2 View  03/01/2015  CLINICAL DATA:  Atrial fibrillation.  Lightheadedness. EXAM: CHEST  2 VIEW COMPARISON:  10/24/2014 FINDINGS: Cardiac pads on the chest. Lungs are clear without airspace disease or pulmonary edema. Nodular density in the left lower chest probably represents a cardiac lead sticker. Heart size is normal. Trachea is midline. No large pleural effusions. IMPRESSION: No acute chest findings. Nodular density in left lower chest is probably related to ECG sticker. Recommend follow-up chest radiograph once cardiac leads and pads can be removed. Electronically Signed   By: Markus Daft M.D.   On: 03/01/2015 14:37      ASSESSMENT AND PLAN: Gabriel Schmidt is a 81 year old white male who had been very stable  and had experienced only 2 prior episodes of atrial fibrillation in over 10 year.  Remotely, he had experienced a previous episode that was short lived while [place] in [place], [place].  He had no  further recurrence for over 8 years and earlier this year.  His Rythmol was discontinued.  Since August 2016 he has had 2 recurrent episodes of atrial fibrillation which were successfully cardioverted the same day of reinitiation.  It is my recommendation that he continue chronic anticoagulation with eliquis at 5 mg twice a day.  With his recurrent symptomatology in over a 5 month period, I have recommended reinstitution of antiarrhythmic therapy.  Presently, he is bradycardic on his current dose of atenolol at 25 mg daily.  His QTc interval is normal.  He has normal LV function.  I am recommending he undergo a 2-D echo Doppler study.  I elected to start him on flecainide at 50 mg twice a day.  He will decrease his atenolol to 12.5 mg and if his resting pulse gets below 54 he will discontinue this.  He will discontinue aspirin since he does not have established CAD.  I have recommended that he see Roderic Palau in the A. fib clinic next week following institution of flecanide and if he is tolerating this well and remained stable I will see him approximately 6 weeks later for follow-up evaluation.  His most recent lipid studies are excellent and he is tolerating Crestor without side effects.  Troy Sine, MD, [facility]  03/04/2015 12:10 PM

## 2015-03-09 ENCOUNTER — Encounter (HOSPITAL_COMMUNITY): Payer: Self-pay | Admitting: Nurse Practitioner

## 2015-03-09 ENCOUNTER — Ambulatory Visit (HOSPITAL_COMMUNITY)
Admission: RE | Admit: 2015-03-09 | Discharge: 2015-03-09 | Disposition: A | Discharge status: Home / Self Care | Payer: Medicare Other | Source: Ambulatory Visit | Attending: Nurse Practitioner | Admitting: Nurse Practitioner

## 2015-03-09 ENCOUNTER — Ambulatory Visit (HOSPITAL_BASED_OUTPATIENT_CLINIC_OR_DEPARTMENT_OTHER)
Admission: RE | Admit: 2015-03-09 | Discharge: 2015-03-09 | Disposition: A | Discharge status: Home / Self Care | Payer: Medicare Other | Source: Ambulatory Visit | Attending: Cardiovascular Disease | Admitting: Cardiovascular Disease

## 2015-03-09 VITALS — BP 130/70 | HR 58 | Ht 73.0 in | Wt 183.0 lb

## 2015-03-09 DIAGNOSIS — I480 Paroxysmal atrial fibrillation: Secondary | ICD-10-CM

## 2015-03-09 NOTE — Progress Notes (Signed)
Patient ID: Gabriel Schmidt, male   DOB: [birth year], 81 y.o.   MRN: [id number]     Primary Care Physician:  Melinda Crutch, MD Referring Physician: [facility] Cardiologist: Dr. Alonna Buckler E Nehring is a 81 y.o. male with a h/o infrequent episodes of afib, '06,'08, 8/16 and most recently 03/01/15 with RVR, Usually  afib episodes are complicated with hypotension with a systolic BP around 80. He was successfully cardioverted and was seen f/u  with Dr. Claiborne Billings at which time flecainide 50 mg bid was started. He is being seen in afib clinic for f/u. Eliquis was restarted for a chadsvasc score of 2. Pt wants to take for 30 days after cardioversion and stop but I have informed him guidelines say he should be on daily. He is in SR today and has not had any further issues, tolerating flecainide. Echo done earlier today, report is pending. He does exercise regularly and is of normal weight. He does drink alcohol, moderate caffeine and we did discuss alcohol being a trigger for afib. He had been on Rythmol  but because of lack of afib episodes it was discontinued in  February 2016. His wife states he does not snore. He does have a tendency for bradycardia, but currently is in the upper 50's on 12.5 mg atenolol .  Today, he denies symptoms of palpitations, chest pain, shortness of breath, orthopnea, PND, lower extremity edema, dizziness, presyncope, syncope, or neurologic sequela. The patient is tolerating medications without difficulties and is otherwise without complaint today.   Past Medical History  Diagnosis Date  . Hyperlipidemia   . Paroxysmal a-fib ([place])   . Mitral valve prolapse   . Fatigue    Past Surgical History  Procedure Laterality Date  . Tonsillectomy and adenoidectomy    . Cardioversion  04/14/2006  . Cardiac catheterization  06/19/2004    Current Outpatient Prescriptions  Medication Sig Dispense Refill  . apixaban (ELIQUIS) 5 MG TABS tablet Take 1 tablet (5 mg total) by mouth 2 (two) times  daily. (Patient taking differently: Take 5 mg by mouth 2 (two) times daily as needed (afib). ) 60 tablet 6  . aspirin EC 81 MG tablet Take 81 mg by mouth daily.    Marland Kitchen atenolol (TENORMIN) 25 MG tablet Take 0.5 tablets (12.5 mg total) by mouth daily. 90 tablet 1  . CRESTOR 10 MG tablet Take 1 tablet by mouth  daily (Patient taking differently: Take 10 mg by mouth daily at bedtime.) 90 tablet 3  . flecainide (TAMBOCOR) 50 MG tablet Take 1 tablet (50 mg total) by mouth 2 (two) times daily. 60 tablet 6  . Pseudoephedrine-APAP-DM (DAYQUIL MULTI-SYMPTOM COLD/FLU PO) Take 1 capsule by mouth daily as needed (cough, cold).    . calcium carbonate (TUMS - DOSED IN MG ELEMENTAL CALCIUM) 500 MG chewable tablet Chew 1 tablet by mouth 2 (two) times daily as needed for indigestion or heartburn. Reported on 03/09/2015     No current facility-administered medications for this encounter.    Allergies  Allergen Reactions  . Penicillins Rash    Has patient had a PCN reaction causing immediate rash, facial/tongue/throat swelling, SOB or lightheadedness with hypotension: No Has patient had a PCN reaction causing severe rash involving mucus membranes or skin necrosis: No Has patient had a PCN reaction that required hospitalization No Has patient had a PCN reaction occurring within the last 10 years: No If all of the above answers are "NO", then may proceed with Cephalosporin use.  Social History   Social History  . Marital Status: Married    Spouse Name: N/A  . Number of Children: N/A  . Years of Education: N/A   Occupational History  . Not on file.   Social History Main Topics  . Smoking status: Never Smoker   . Smokeless tobacco: Never Used  . Alcohol Use: 1.8 - 2.4 oz/week    3-4 Standard drinks or equivalent per week  . Drug Use: No  . Sexual Activity: Not on file   Other Topics Concern  . Not on file   Social History Narrative    Family History  Problem Relation Age of Onset  . Stroke  Father     ROS- All systems are reviewed and negative except as per the HPI above  Physical Exam: Filed Vitals:   03/09/15 1429  BP: 130/70  Pulse: 58  Height: 6\' 1"  (1.854 m)  Weight: 183 lb (83.008 kg)    GEN- The patient is well appearing, alert and oriented x 3 today.   Head- normocephalic, atraumatic Eyes-  Sclera clear, conjunctiva pink Ears- hearing intact Oropharynx- clear Neck- supple, no JVP Lymph- no cervical lymphadenopathy Lungs- Clear to ausculation bilaterally, normal work of breathing Heart- Regular rate and rhythm, no murmurs, rubs or gallops, PMI not laterally displaced GI- soft, NT, ND, + BS Extremities- no clubbing, cyanosis, or edema MS- no significant deformity or atrophy Skin- no rash or lesion Psych- euthymic mood, full affect Neuro- strength and sensation are intact  EKG- Sinus brady, normal EKG, pr int 184 ms, qrs 90 ms, qtc 416 ms. Epic records reviewed  Assessment and Plan: 1. PAF Started on flecainide 50 mg bid and is tolerating with normal EKG intervals Continue with atenolol, but watch for symptomatic bradycardia He is difficult to treat with meds when he has breakthrough afib, being unstable with RVR and hypotension GXT scheduled at [facility] to evaluate for any exercise induced conduction problems or arhythmia.  2. Chadsvasc score of 2 He was advised to stay on eliquis for stroke prevention  F/u with Dr. Claiborne Billings as scheduled 3/21. Afib clinic as needed  Geroge Baseman. Stepfanie Yott, [facility] [address] [place], [place] [address] [phone]

## 2015-03-09 NOTE — Progress Notes (Signed)
  Echocardiogram 2D Echocardiogram has been performed.  Jennette Dubin 03/09/2015, 1:46 PM

## 2015-03-09 NOTE — Patient Instructions (Addendum)
Your physician has requested that you have an exercise tolerance test. For further information please visit [email or web address]. Please also follow instruction sheet, as given.  -No Perfumes or after shave  - No caffeine or tobacco 3 hours prior I will call you once this is scheduled.

## 2015-03-17 ENCOUNTER — Telehealth (HOSPITAL_COMMUNITY): Payer: Self-pay

## 2015-03-17 NOTE — Telephone Encounter (Signed)
Encounter complete. 

## 2015-03-17 NOTE — Telephone Encounter (Signed)
Awaiting return call

## 2015-03-22 ENCOUNTER — Ambulatory Visit (HOSPITAL_COMMUNITY)
Admission: RE | Admit: 2015-03-22 | Discharge: 2015-03-22 | Disposition: A | Discharge status: Home / Self Care | Payer: Medicare Other | Source: Ambulatory Visit | Attending: Cardiology | Admitting: Cardiology

## 2015-03-22 DIAGNOSIS — I480 Paroxysmal atrial fibrillation: Secondary | ICD-10-CM | POA: Diagnosis not present

## 2015-03-24 ENCOUNTER — Other Ambulatory Visit: Payer: Self-pay | Admitting: *Deleted

## 2015-03-24 ENCOUNTER — Telehealth: Payer: Self-pay | Admitting: *Deleted

## 2015-03-24 LAB — EXERCISE TOLERANCE TEST
CHL CUP MPHR: 142 {beats}/min
CHL RATE OF PERCEIVED EXERTION: 17
CSEPEW: 13.4 METS
CSEPHR: 102 %
CSEPPHR: 146 {beats}/min
Exercise duration (min): 11 min
Rest HR: 65 {beats}/min

## 2015-03-24 MED ORDER — FLECAINIDE ACETATE 50 MG PO TABS: 50.0000 mg | ORAL_TABLET | Freq: Two times a day (BID) | ORAL | Status: DC

## 2015-03-24 NOTE — Telephone Encounter (Signed)
Requesting surgical clearance:   1. Type of surgery: colonoscopy  2. Surgeon: Laurence Spates  3. Surgical date: 05/22/15  4. Medications that need to be held: ELIQUIS                       DR EDWARDS REQUEST PATIENT TO HOLD THE DAY BEFORE AND THE MORNING OF.  Special instructions:

## 2015-04-18 ENCOUNTER — Other Ambulatory Visit: Payer: Self-pay | Admitting: Cardiovascular Disease

## 2015-04-18 MED ORDER — FLECAINIDE ACETATE 50 MG PO TABS: 50.0000 mg | ORAL_TABLET | Freq: Two times a day (BID) | ORAL | Status: DC

## 2015-04-18 NOTE — Telephone Encounter (Signed)
Sent clearance to Dr Laurence Spates via [facility]

## 2015-04-18 NOTE — Telephone Encounter (Signed)
Seth Ward for colonscopy; hold eliquis for 4 doses(48 hrs)

## 2015-05-16 ENCOUNTER — Ambulatory Visit (INDEPENDENT_AMBULATORY_CARE_PROVIDER_SITE_OTHER): Payer: Medicare Other | Admitting: Cardiovascular Disease

## 2015-05-16 ENCOUNTER — Encounter: Payer: Self-pay | Admitting: Cardiovascular Disease

## 2015-05-16 VITALS — BP 116/66 | HR 58 | Ht 74.0 in | Wt 180.0 lb

## 2015-05-16 DIAGNOSIS — E785 Hyperlipidemia, unspecified: Secondary | ICD-10-CM

## 2015-05-16 DIAGNOSIS — I340 Nonrheumatic mitral (valve) insufficiency: Secondary | ICD-10-CM

## 2015-05-16 DIAGNOSIS — I341 Nonrheumatic mitral (valve) prolapse: Secondary | ICD-10-CM | POA: Diagnosis not present

## 2015-05-16 DIAGNOSIS — I480 Paroxysmal atrial fibrillation: Secondary | ICD-10-CM | POA: Diagnosis not present

## 2015-05-16 DIAGNOSIS — Z7901 Long term (current) use of anticoagulants: Secondary | ICD-10-CM

## 2015-05-16 NOTE — Patient Instructions (Signed)
Your physician wants you to follow-up in: 6 months or sooner if needed. You will receive a reminder letter in the mail two months in advance. If you don't receive a letter, please call our office to schedule the follow-up appointment.   If you need a refill on your cardiac medications before your next appointment, please call your pharmacy. 

## 2015-05-17 ENCOUNTER — Encounter: Payer: Self-pay | Admitting: Cardiovascular Disease

## 2015-05-17 DIAGNOSIS — Z7901 Long term (current) use of anticoagulants: Secondary | ICD-10-CM | POA: Insufficient documentation

## 2015-05-17 NOTE — Progress Notes (Signed)
Patient ID: Gabriel Schmidt, male   DOB: [birth year], 81 y.o.   MRN: [id number]    Primary M.D.: Dr. Gus Height  HPI: Gabriel Schmidt is a 81 y.o. male who presents to the office today for an 8 month follow up cardiology evaluation.  Gabriel Schmidt has a history of PAF with 2 previously documented episodes in 2006 and in February 2008 prior to the past 6 months  He had been on combination therapy with Rythmol as well as low-dose beta blocker therapy.  He had been maintained on Coumadin and this was ultimately discontinued in September 2011.  He also has a history of hyperlipidemia for which she's been treated with Crestor 10 mg, mitral valve prolapse with mild MR, and remotely had noted some fatigability.  I last saw him in March 2014 at which time he was doing well on current therapy.  He apparently had seen Lesia Hausen in June 2015 and at that time expressed to her his wishes to discontinue Rythmol if possible.  She did not feel comfortable making that decision.  When I  saw him in February 2017, I recommended that he try to wean off Rythmol which he had done over a three-week period.  Since that time, he has been on atenolol 25 mg daily in addition to aspirin 81 mg, omega-3 fatty acids, and Crestor 10 mg.  He is unaware of any breakthrough rhythm disturbance.  He continues to remain stable.  In the past the episodes of paroxysmal atrial fibrillation occurred in the setting of more significant alcohol intake.  When I last saw him in May, he was feeling great and was  exercising regularly.  He works out 5 days per week.  He has remained in good physical shape.  He denies any weight gain.  He denies chest pain.  He denies shortness of breath.  He denies bleeding.  When I saw him, he had 2 recurrent episodes of atrial fibrillation.  The first was in 10/24/2014 when he developed atrial fibrillation while working out.  He presented to the emergency room  and was given eliquis and successfully cardioverted that  day back to sinus rhythm.  He went to the atrial fibrillation clinic in follow-up.  He had a cha2ds2vasc score of 2.  He was felt that he did not require daily medication at that point, particularly with a somewhat low blood pressure.  When he had developed atrial fibrillation with a rapid ventricular response he was hypotensive.  He had a recurrent episode on 03/02/2015 presented to the emergency room with recurrent onset of atrial fibrillation.  Eliquis was reinstituted since he had only taken it for a month after his August cardioversion and he again was successfully cardioverted back to sinus rhythm.  When I last saw him in January because of his recurrent episodes, I recommended initiation of flecainide 50 mg twice a day.  He has tolerated this well and has not had any recurrent episodes.  He was seen in follow-up in the A. fib clinic by Roderic Palau and remained stable.  He subsequently underwent a routine treadmill test to make certain he did not have any fleck a night-induced proarrhythmia and this was normal without chest pain, rhythm abnormality or ECG changes of ischemia.  He presents for evaluation.   Past Medical History  Diagnosis Date  . Hyperlipidemia   . Paroxysmal a-fib ([place])   . Mitral valve prolapse   . Fatigue     Past Surgical History  Procedure  Laterality Date  . Tonsillectomy and adenoidectomy    . Cardioversion  04/14/2006  . Cardiac catheterization  06/19/2004    Allergies  Allergen Reactions  . Penicillins Rash    Has patient had a PCN reaction causing immediate rash, facial/tongue/throat swelling, SOB or lightheadedness with hypotension: No Has patient had a PCN reaction causing severe rash involving mucus membranes or skin necrosis: No Has patient had a PCN reaction that required hospitalization No Has patient had a PCN reaction occurring within the last 10 years: No If all of the above answers are "NO", then may proceed with Cephalosporin use.    Current  Outpatient Prescriptions  Medication Sig Dispense Refill  . apixaban (ELIQUIS) 5 MG TABS tablet Take 1 tablet (5 mg total) by mouth 2 (two) times daily. (Patient taking differently: Take 5 mg by mouth 2 (two) times daily as needed (afib). ) 60 tablet 6  . atenolol (TENORMIN) 25 MG tablet Take 0.5 tablets (12.5 mg total) by mouth daily. 90 tablet 1  . calcium carbonate (TUMS - DOSED IN MG ELEMENTAL CALCIUM) 500 MG chewable tablet Chew 1 tablet by mouth 2 (two) times daily as needed for indigestion or heartburn. Reported on 03/09/2015    . CRESTOR 10 MG tablet Take 1 tablet by mouth  daily (Patient taking differently: Take 10 mg by mouth daily at bedtime.) 90 tablet 3  . Dextromethorphan-Guaifenesin (CORICIDIN HBP CONGESTION/COUGH) 10-200 MG CAPS Take by mouth as needed.    . flecainide (TAMBOCOR) 50 MG tablet Take 1 tablet (50 mg total) by mouth 2 (two) times daily. 180 tablet 0   No current facility-administered medications for this visit.    Social History   Social History  . Marital Status: Married    Spouse Name: N/A  . Number of Children: N/A  . Years of Education: N/A   Occupational History  . Not on file.   Social History Main Topics  . Smoking status: Never Smoker   . Smokeless tobacco: Never Used  . Alcohol Use: 1.8 - 2.4 oz/week    3-4 Standard drinks or equivalent per week  . Drug Use: No  . Sexual Activity: Not on file   Other Topics Concern  . Not on file   Social History Narrative   Socially he is retired.  He has 3 children.  He previously was a [employer].  Family history is notable that both parents are deceased.  Father died of a stroke.  ROS General: Negative; No fevers, chills, or night sweats HEENT: Negative; No changes in vision or hearing, sinus congestion, difficulty swallowing Pulmonary: Negative; No cough, wheezing, shortness of breath, hemoptysis Cardiovascular: See HPI: No chest pain, presyncope, syncope, palpatations GI: Negative; No  nausea, vomiting, diarrhea, or abdominal pain GU: Negative; No dysuria, hematuria, or difficulty voiding Musculoskeletal: Negative; no myalgias, joint pain, or weakness Hematologic: Negative; no easy bruising, bleeding Endocrine: Negative; no heat/cold intolerance; no diabetes, Neuro: Negative; no changes in balance, headaches Skin: Negative; No rashes or skin lesions Psychiatric: Negative; No behavioral problems, depression Sleep: Negative; No snoring,  daytime sleepiness, hypersomnolence, bruxism, restless legs, hypnogognic hallucinations. Other comprehensive 14 point system review is negative   Physical Exam BP 116/66 mmHg  Pulse 58  Ht _0  (1.88 m)  Wt 180 lb (81.647 kg)  BMI 23.10 kg/m2   Wt Readings from Last 3 Encounters:  05/16/15 180 lb (81.647 kg)  03/09/15 183 lb (83.008 kg)  03/03/15 179 lb 11.2 oz (81.511 kg)   General: Alert,  oriented, no distress.  Skin: normal turgor, no rashes, warm and dry HEENT: Normocephalic, atraumatic. Pupils equal round and reactive to light; sclera anicteric; extraocular muscles intact, No lid lag; Nose without nasal septal hypertrophy; Mouth/Parynx benign; Mallinpatti scale 2 Neck: No JVD, no carotid bruits; normal carotid upstroke Lungs: clear to ausculatation and percussion bilaterally; no wheezing or rales, normal inspiratory and expiratory effort Chest wall: without tenderness to palpitation Heart: PMI not displaced, RRR, s1 s2 normal, 1/6 systolic murmur, No diastolic murmur, no rubs, gallops, thrills, or heaves Abdomen: soft, nontender; no hepatosplenomehaly, BS+; abdominal aorta nontender and not dilated by palpation. Back: no CVA tenderness Pulses: 2+  Musculoskeletal: full range of motion, normal strength, no joint deformities Extremities: Pulses 2+, no clubbing cyanosis or edema, Homan's sign negative  Neurologic: grossly nonfocal; Cranial nerves grossly wnl Psychologic: Normal mood and affect  ECG (independently read by  me): Sinus bradycardia 58 bpm.  QTc interval 418 ms.  No significant ST segment changes.  January 2017 ECG (independently read by me): Sinus bradycardia at 51 bpm.  PR interval 180 ms; QTc interval 41 ms.  May 2016 ECG (independently read by me): Sinus bradycardia 59 bpm.  QTc interval 423 ms.  PR interval 188 ms  February 2016 ECG (independently read by me): Sinus bradycardia 59 bpm.  Borderline first-degree AV block with a PR interval of 24 ms.  No significant ST segment changes.  LABS:  BMP Latest Ref Rng 03/01/2015 10/24/2014 07/13/2014  Glucose 65 - 99 mg/dL 99 114(H) 92  BUN 6 - 20 mg/dL _0 Creatinine 0.61 - 1.24 mg/dL 1.18 1.21 0.98  BUN/Creat Ratio 10 - 22 - - 14  Sodium 135 - 145 mmol/L 142 140 142  Potassium 3.5 - 5.1 mmol/L 4.7 4.2 5.0  Chloride 101 - 111 mmol/L 105 106 103  CO2 22 - 32 mmol/L _1 Calcium 8.9 - 10.3 mg/dL 9.4 9.1 9.1     Hepatic Function Latest Ref Rng 07/13/2014 08/02/2013  Total Protein 6.0 - 8.5 g/dL 5.9(L) 6.1  Albumin 3.5 - 4.8 g/dL 4.0 4.1  AST 0 - 40 IU/L 20 15  ALT 0 - 44 IU/L 11 10  Alk Phosphatase 39 - 117 IU/L 47 41  Total Bilirubin 0.0 - 1.2 mg/dL 1.0 1.4(H)     CBC Latest Ref Rng 03/01/2015 10/24/2014 07/13/2014  WBC 4.0 - 10.5 K/uL 6.4 4.9 4.8  Hemoglobin 13.0 - 17.0 g/dL 15.1 15.2 -  Hematocrit 39.0 - 52.0 % 46.1 45.7 43.9  Platelets 150 - 400 K/uL 279 260 263   Lab Results  Component Value Date   MCV 89.5 03/01/2015   MCV 90.5 10/24/2014   MCV 90 07/13/2014   Lab Results  Component Value Date   TSH 1.700 07/13/2014    BNP No results found for: BNP  ProBNP No results found for: PROBNP   Lipid Panel     Component Value Date/Time   CHOL 141 07/13/2014 0923   CHOL 154 08/02/2013 0835   TRIG 78 07/13/2014 0923   HDL 64 07/13/2014 0923   HDL 57 08/02/2013 0835   CHOLHDL 2.7 08/02/2013 0835   VLDL 17 08/02/2013 0835   LDLCALC 61 07/13/2014 0923   LDLCALC 80 08/02/2013 0835     RADIOLOGY: No results  found.    ASSESSMENT AND PLAN: Mr. Jakeb Burkey is a 81 year old white male who has a history of paroxysmal atrial fibrillation and had only experienced only 2 prior episodes of atrial  fibrillation in over 10 year.  Remotely, he had experienced a previous episode that was short lived while [place] in [place], [place].  He had no further recurrence for over 8 years and last year he requested to be taken off his antiarrhythmic therapy and since he had been so stable Rythmol was discontinued.  Since August 2016 he has had 2 recurrent episodes of atrial fibrillation which were successfully cardioverted the same day of reinitiation.  He is now on Eliquis for anticoagulation.  Since initiating fleck and 9, he has remained stable and is unaware of any palpitations or arrhythmia.  I reviewed his exercise treadmill test which was done to make certain he did not have any flecanide induced proarrhythmia and this was stable.  His heart rate is 58.  QTc interval is normal.  He continues to be on atenolol 12.5 mg.  He denies bleeding on Eliquis.  He tells me he will need to undergo colonoscopy which is scheduled for 06/05/2015.  I recommended that he not take his evening dose of Eliquis on April 8 and not take any Eliquis on April 9 for his planned a.m. colonoscopy on April 10.  Eliquis should be reinstituted once he is stable from a GI standpoint.  He continues to tolerate Crestor 10 mg daily for his hyperlipidemia.  LDL in May 2016 was excellent at 61.  As long as he continues to do well I will see him in 6 months for reevaluation.  Troy Sine, MD, [facility]  05/17/2015 10:42 PM

## 2015-06-08 ENCOUNTER — Other Ambulatory Visit: Payer: Self-pay | Admitting: Cardiovascular Disease

## 2015-06-08 NOTE — Telephone Encounter (Signed)
Rx(s) sent to pharmacy electronically.  

## 2015-07-04 ENCOUNTER — Other Ambulatory Visit: Payer: Self-pay | Admitting: Cardiovascular Disease

## 2015-07-04 NOTE — Telephone Encounter (Signed)
Rx refill sent to pharmacy. 

## 2015-07-28 ENCOUNTER — Other Ambulatory Visit (HOSPITAL_COMMUNITY): Payer: Self-pay | Admitting: Nurse Practitioner

## 2015-12-12 ENCOUNTER — Other Ambulatory Visit: Payer: Self-pay | Admitting: Cardiovascular Disease

## 2016-01-10 ENCOUNTER — Other Ambulatory Visit: Payer: Self-pay | Admitting: Cardiovascular Disease

## 2016-01-29 ENCOUNTER — Telehealth: Payer: Self-pay | Admitting: Cardiovascular Disease

## 2016-01-29 NOTE — Telephone Encounter (Signed)
New message        *STAT* If patient is at the pharmacy, call can be transferred to refill team.   1. Which medications need to be refilled? (please list name of each medication and dose if known) flecainide  2. Which pharmacy/location (including street and city if local pharmacy) is medication to be sent to? [facility] [address] in [place] 3. Do they need a 30 day or 90 day supply? 30 day

## 2016-01-30 MED ORDER — FLECAINIDE ACETATE 50 MG PO TABS: 50.0000 mg | ORAL_TABLET | Freq: Two times a day (BID) | ORAL | 0 refills | Status: DC

## 2016-01-30 NOTE — Telephone Encounter (Signed)
Rx has been sent to the pharmacy electronically. ° °

## 2016-02-28 ENCOUNTER — Other Ambulatory Visit: Payer: Self-pay

## 2016-02-28 MED ORDER — FLECAINIDE ACETATE 50 MG PO TABS: 50.0000 mg | ORAL_TABLET | Freq: Two times a day (BID) | ORAL | 0 refills | Status: DC

## 2016-02-28 MED ORDER — ATENOLOL 25 MG PO TABS: 12.5000 mg | ORAL_TABLET | Freq: Two times a day (BID) | ORAL | 0 refills | Status: DC

## 2016-02-29 ENCOUNTER — Other Ambulatory Visit: Payer: Self-pay

## 2016-02-29 MED ORDER — ROSUVASTATIN CALCIUM 10 MG PO TABS: 10.0000 mg | ORAL_TABLET | Freq: Every day | ORAL | 0 refills | Status: DC

## 2016-03-10 ENCOUNTER — Other Ambulatory Visit (HOSPITAL_COMMUNITY): Payer: Self-pay | Admitting: Nurse Practitioner

## 2016-03-25 ENCOUNTER — Encounter: Payer: Self-pay | Admitting: Cardiovascular Disease

## 2016-03-25 ENCOUNTER — Ambulatory Visit (INDEPENDENT_AMBULATORY_CARE_PROVIDER_SITE_OTHER): Payer: Medicare Other | Admitting: Cardiovascular Disease

## 2016-03-25 VITALS — BP 106/64 | HR 64 | Ht 73.5 in | Wt 186.4 lb

## 2016-03-25 DIAGNOSIS — I480 Paroxysmal atrial fibrillation: Secondary | ICD-10-CM

## 2016-03-25 DIAGNOSIS — E785 Hyperlipidemia, unspecified: Secondary | ICD-10-CM

## 2016-03-25 DIAGNOSIS — Z7901 Long term (current) use of anticoagulants: Secondary | ICD-10-CM

## 2016-03-25 DIAGNOSIS — E7800 Pure hypercholesterolemia, unspecified: Secondary | ICD-10-CM

## 2016-03-25 MED ORDER — ATENOLOL 25 MG PO TABS: 12.5000 mg | ORAL_TABLET | Freq: Two times a day (BID) | ORAL | 3 refills | Status: DC

## 2016-03-25 MED ORDER — FLECAINIDE ACETATE 50 MG PO TABS: 50.0000 mg | ORAL_TABLET | Freq: Two times a day (BID) | ORAL | 3 refills | Status: DC

## 2016-03-25 MED ORDER — APIXABAN 5 MG PO TABS: 5.0000 mg | ORAL_TABLET | Freq: Two times a day (BID) | ORAL | 11 refills | Status: DC

## 2016-03-25 MED ORDER — ROSUVASTATIN CALCIUM 10 MG PO TABS: 10.0000 mg | ORAL_TABLET | Freq: Every day | ORAL | 3 refills | Status: DC

## 2016-03-25 NOTE — Patient Instructions (Signed)
yearYour physician wants you to follow-up in: 1 year or sooner if needed. You will receive a reminder letter in the mail two months in advance. If you don't receive a letter, please call our office to schedule the follow-up appointment.

## 2016-03-25 NOTE — Progress Notes (Signed)
Patient ID: Gabriel Schmidt, male   DOB: 1935/01/13, 82 y.o.   MRN: [id number]    Primary M.D.: Dr. Gus Height  HPI: Gabriel Schmidt is a 82 y.o. male who presents to the office today for an 10 month follow up cardiology evaluation.  [name] has a history of PAF with 2 previously documented episodes in 2006 and in February 2008 prior to the past 6 months  He had been on combination therapy with Rythmol as well as low-dose beta blocker therapy.  He had been maintained on Coumadin and this was ultimately discontinued in September 2011.  He also has a history of hyperlipidemia for which she's been treated with Crestor 10 mg, mitral valve prolapse with mild MR, and remotely had noted some fatigability.  I last saw him in March 2014 at which time he was doing well on current therapy.  He apparently had seen Lesia Hausen in June 2015 and at that time expressed to her his wishes to discontinue Rythmol if possible.  She did not feel comfortable making that decision.  When I  saw him in February 2017, I recommended that he try to wean off Rythmol which he had done over a three-week period.  Since that time, he has been on atenolol 25 mg daily in addition to aspirin 81 mg, omega-3 fatty acids, and Crestor 10 mg.  He is unaware of any breakthrough rhythm disturbance.  He continues to remain stable.  In the past the episodes of paroxysmal atrial fibrillation occurred in the setting of more significant alcohol intake.  When I last saw him in May, he was feeling great and was  exercising regularly.  He works out 5 days per week.  He has remained in good physical shape.  He denies any weight gain.  He denies chest pain.  He denies shortness of breath.  He denies bleeding.  When I saw him, he had 2 recurrent episodes of atrial fibrillation.  The first was in 10/24/2014 when he developed atrial fibrillation while working out.  He presented to the emergency room  and was given eliquis and successfully cardioverted that  day back to sinus rhythm.  He went to the atrial fibrillation clinic in follow-up.  He had a cha2ds2vasc score of 2.  He was felt that he did not require daily medication at that point, particularly with a somewhat low blood pressure.  When he had developed atrial fibrillation with a rapid ventricular response he was hypotensive.  He had a recurrent episode on 03/02/2015 presented to the emergency room with recurrent onset of atrial fibrillation.  Eliquis was reinstituted since he had only taken it for a month after his August cardioversion and he again was successfully cardioverted back to sinus rhythm.  When I  saw him in January 2017 because of his recurrent episodes, I recommended initiation of flecainide 50 mg twice a day.  He has tolerated this well and has not had any recurrent episodes.  He underwent an echo Doppler study in January 2017 which showed an EF of 60-65% with grade 2 diastolic dysfunction.  There was mild biatrial enlargement.  PA pressure was 34 mm.  There was trivial MR and TR.  He underwent a routine treadmill test on flexion.  I did make certain he did not have any exercise-induced arrhythmia.  This was normal.  He exercised for 9 minutes and did not develop chest pain or ECG changes.  Over the past year, he has remained stable.  He  did denies any issues.  Taking fleck a night.  He denies chest pain or shortness of breath.  He remains active and exercises at least 4-5 days per week, both doing cardiovascular as well as resistant training.  He presents for follow-up evaluation.  Past Medical History:  Diagnosis Date  . Fatigue   . Hyperlipidemia   . Mitral valve prolapse   . Paroxysmal a-fib [facility])     Past Surgical History:  Procedure Laterality Date  . CARDIAC CATHETERIZATION  06/19/2004  . CARDIOVERSION  04/14/2006  . TONSILLECTOMY AND ADENOIDECTOMY      Allergies  Allergen Reactions  . Penicillins Rash    Has patient had a PCN reaction causing immediate rash,  facial/tongue/throat swelling, SOB or lightheadedness with hypotension: No Has patient had a PCN reaction causing severe rash involving mucus membranes or skin necrosis: No Has patient had a PCN reaction that required hospitalization No Has patient had a PCN reaction occurring within the last 10 years: No If all of the above answers are "NO", then may proceed with Cephalosporin use.    Current Outpatient Prescriptions  Medication Sig Dispense Refill  . apixaban (ELIQUIS) 5 MG TABS tablet Take 1 tablet (5 mg total) by mouth 2 (two) times daily. 60 tablet 11  . atenolol (TENORMIN) 25 MG tablet Take 0.5 tablets (12.5 mg total) by mouth 2 (two) times daily. 90 tablet 3  . calcium carbonate (TUMS - DOSED IN MG ELEMENTAL CALCIUM) 500 MG chewable tablet Chew 1 tablet by mouth 2 (two) times daily as needed for indigestion or heartburn. Reported on 03/09/2015    . flecainide (TAMBOCOR) 50 MG tablet Take 1 tablet (50 mg total) by mouth 2 (two) times daily. 180 tablet 3  . rosuvastatin (CRESTOR) 10 MG tablet Take 1 tablet (10 mg total) by mouth daily. 90 tablet 3   No current facility-administered medications for this visit.     Social History   Social History  . Marital status: Married    Spouse name: N/A  . Number of children: N/A  . Years of education: N/A   Occupational History  . Not on file.   Social History Main Topics  . Smoking status: Never Smoker  . Smokeless tobacco: Never Used  . Alcohol use 1.8 - 2.4 oz/week    3 - 4 Standard drinks or equivalent per week  . Drug use: No  . Sexual activity: Not on file   Other Topics Concern  . Not on file   Social History Narrative  . No narrative on file   Socially he is retired.  He has 3 children.  He previously was a [employer].  Family history is notable that both parents are deceased.  Father died of a stroke.  ROS General: Negative; No fevers, chills, or night sweats HEENT: Negative; No changes in vision or hearing,  sinus congestion, difficulty swallowing Pulmonary: Negative; No cough, wheezing, shortness of breath, hemoptysis Cardiovascular: See HPI: No chest pain, presyncope, syncope, palpatations GI: Negative; No nausea, vomiting, diarrhea, or abdominal pain GU: Negative; No dysuria, hematuria, or difficulty voiding Musculoskeletal: Negative; no myalgias, joint pain, or weakness Hematologic: Negative; no easy bruising, bleeding Endocrine: Negative; no heat/cold intolerance; no diabetes, Neuro: Negative; no changes in balance, headaches Skin: Negative; No rashes or skin lesions Psychiatric: Negative; No behavioral problems, depression Sleep: Negative; No snoring,  daytime sleepiness, hypersomnolence, bruxism, restless legs, hypnogognic hallucinations. Other comprehensive 14 point system review is negative   Physical Exam BP 106/64  Pulse 64   Ht 6' 1.5" (1.867 m)   Wt 186 lb 6 oz (84.5 kg)   BMI 24.26 kg/m    Repeat blood pressure by me was 118/68  Wt Readings from Last 3 Encounters:  03/25/16 186 lb 6 oz (84.5 kg)  05/16/15 180 lb (81.6 kg)  03/09/15 183 lb (83 kg)   General: Alert, oriented, no distress.  Skin: normal turgor, no rashes, warm and dry HEENT: Normocephalic, atraumatic. Pupils equal round and reactive to light; sclera anicteric; extraocular muscles intact, No lid lag; Nose without nasal septal hypertrophy; Mouth/Parynx benign; Mallinpatti scale 2 Neck: No JVD, no carotid bruits; normal carotid upstroke Lungs: clear to ausculatation and percussion bilaterally; no wheezing or rales, normal inspiratory and expiratory effort Chest wall: without tenderness to palpitation Heart: PMI not displaced, RRR, s1 s2 normal, 1/6 systolic murmur, No diastolic murmur, no rubs, gallops, thrills, or heaves Abdomen: soft, nontender; no hepatosplenomehaly, BS+; abdominal aorta nontender and not dilated by palpation. Back: no CVA tenderness Pulses: 2+  Musculoskeletal: full range of motion,  normal strength, no joint deformities Extremities: Pulses 2+, no clubbing cyanosis or edema, Homan's sign negative  Neurologic: grossly nonfocal; Cranial nerves grossly wnl Psychologic: Normal mood and affect  ECG (independently read by me): Normal sinus rhythm at 64 bpm.  QTc interval 427 ms.  PR interval 194 ms.  No significant ST-T changes.  March 2017 ECG (independently read by me): Sinus bradycardia 58 bpm.  QTc interval 418 ms.  No significant ST segment changes.  January 2017 ECG (independently read by me): Sinus bradycardia at 51 bpm.  PR interval 180 ms; QTc interval 41 ms.  May 2016 ECG (independently read by me): Sinus bradycardia 59 bpm.  QTc interval 423 ms.  PR interval 188 ms  February 2016 ECG (independently read by me): Sinus bradycardia 59 bpm.  Borderline first-degree AV block with a PR interval of 24 ms.  No significant ST segment changes.  LABS:  BMP Latest Ref Rng & Units 03/01/2015 10/24/2014 07/13/2014  Glucose 65 - 99 mg/dL 99 114(H) 92  BUN 6 - 20 mg/dL '13 16 14  ' Creatinine 0.61 - 1.24 mg/dL 1.18 1.21 0.98  BUN/Creat Ratio 10 - 22 - - 14  Sodium 135 - 145 mmol/L 142 140 142  Potassium 3.5 - 5.1 mmol/L 4.7 4.2 5.0  [place] 101 - 111 mmol/L 105 106 103  CO2 22 - 32 mmol/L '30 28 24  ' Calcium 8.9 - 10.3 mg/dL 9.4 9.1 9.1     Hepatic Function Latest Ref Rng & Units 07/13/2014 08/02/2013  Total Protein 6.0 - 8.5 g/dL 5.9(L) 6.1  Albumin 3.5 - 4.8 g/dL 4.0 4.1  AST 0 - 40 IU/L 20 15  ALT 0 - 44 IU/L 11 10  Alk Phosphatase 39 - 117 IU/L 47 41  Total Bilirubin 0.0 - 1.2 mg/dL 1.0 1.4(H)     CBC Latest Ref Rng & Units 03/01/2015 10/24/2014 07/13/2014  WBC 4.0 - 10.5 K/uL 6.4 4.9 4.8  Hemoglobin 13.0 - 17.0 g/dL 15.1 15.2 -  Hematocrit 39.0 - 52.0 % 46.1 45.7 43.9  Platelets 150 - 400 K/uL 279 260 263   Lab Results  Component Value Date   MCV 89.5 03/01/2015   MCV 90.5 10/24/2014   MCV 90 07/13/2014   Lab Results  Component Value Date   TSH 1.700 07/13/2014      BNP No results found for: BNP  ProBNP No results found for: PROBNP   Lipid Panel  Component Value Date/Time   CHOL 141 07/13/2014 0923   TRIG 78 07/13/2014 0923   HDL 64 07/13/2014 0923   CHOLHDL 2.7 08/02/2013 0835   VLDL 17 08/02/2013 0835   LDLCALC 61 07/13/2014 0923     RADIOLOGY: No results found.  IMPRESSION:  1. PAF (paroxysmal atrial fibrillation) ([place])   2. Hyperlipidemia, unspecified hyperlipidemia type   3. Anticoagulation adequate   4. Pure hypercholesterolemia    ASSESSMENT AND PLAN: Mr. Keoni Standlee is a 82 year old white male who has a history of paroxysmal atrial fibrillation and had only experienced only 2 prior episodes of atrial fibrillation in over 10 year period.  Remotely, he had experienced a previous episode that was short lived while [place] in [place], [place].  He had no further recurrence for over 8 years and last year he requested to be taken off his antiarrhythmic therapy and since he had been so stable Rythmol was discontinued.  He developed several recurrent episodes of atrial fibrillation on antiarrhythmic therapy since August 2016 and underwent cardioversion.  He is now on Eliquis for anticoagulation.  Since initiating flecanide, he has remained stable and is unaware of any palpitations or arrhythmia.  His exercise treadmill test which was done to make certain he did not have any flecanide induced proarrhythmia and this was stable.  His blood pressure today is stable.  His ventricular rate is 64 bpm on atenolol 25 mg in addition to flexion 950 mg twice a day.  He continues to take Crestor 10 mg daily for hyperlipidemia.  He denies any bleeding on anticoagulation.  He is exercising 4-5 days per week without restriction.  He will continue his current medical regimen.  As long as he remains stable I will see him in one year for reevaluation, but we'll see him sooner if recurrent problems arise.    Time spent: 25 minutes Troy Sine, MD,  [facility]  03/25/2016 12:28 PM

## 2016-07-26 ENCOUNTER — Telehealth: Payer: Self-pay | Admitting: Cardiovascular Disease

## 2016-07-26 MED ORDER — FLECAINIDE ACETATE 50 MG PO TABS: 50.0000 mg | ORAL_TABLET | Freq: Two times a day (BID) | ORAL | 0 refills | Status: DC

## 2016-07-26 NOTE — Telephone Encounter (Signed)
New Message      *STAT* If patient is at the pharmacy, call can be transferred to refill team.   1. Which medications need to be refilled? (please list name of each medication and dose if known)   flecainide (TAMBOCOR) 50 MG tablet Take 1 tablet (50 mg total) by mouth 2 (two) times daily.     2. Which pharmacy/location (including street and city if local pharmacy) is medication to be sent to? [facility] [phone]  3. Do they need a 30 day or 90 day supply?  5 day supply  Forgot his prescription at home

## 2016-07-26 NOTE — Telephone Encounter (Signed)
Unable to locate pharmacy listed-called and closed permanently.   Called patient-he is on vacation and forgot his medication.  Request to call in 10 day supply to [facility] in [place].  10 day supply sent electronically to [facility].  Patient aware.

## 2016-10-03 ENCOUNTER — Telehealth: Payer: Self-pay | Admitting: Cardiovascular Disease

## 2016-10-03 NOTE — Telephone Encounter (Signed)
Received call directly from operator for patient; wife is on the phone and states the patient was stung by 3 yellow jackets just a few minutes ago and she wants to know if she can give him any benadryl. Wife denies that patient has an symptoms of anaphylactic shock or respiratory distress. I placed her on hold and spoke with Eino Farber, RPh who states patient can receive benadryl for a short time to help with symptoms of the sting.  I advised patient's wife of Kelly's advice and she verbalized understanding. She thanked me for my help.

## 2016-10-03 NOTE — Telephone Encounter (Signed)
New message      Pt is on several heart medications.  He has just been stung by 3 yellow jackets.  Can he take a benadryl?

## 2016-10-18 ENCOUNTER — Telehealth: Payer: Self-pay | Admitting: Cardiovascular Disease

## 2016-10-18 NOTE — Telephone Encounter (Signed)
New message    Pt is calling asking for a call back. He states he is currently taking eliquis and wants to switch to warfarin. Please call.

## 2016-10-18 NOTE — Telephone Encounter (Signed)
Spoke with  wife, Levander Campion  She states that pt is in the donut hole and Eliquis 5mg  is too expensive $156 for 30 days. She states that she will check into pt assist program and see if they will help, but he would like to switch back to coumadin, it is more affordable. Please advise

## 2016-10-21 NOTE — Telephone Encounter (Signed)
eliquis if preferable, but if cannot get assistance then ok to switch back to coumadin but will need frequent INR assessment; arrange with PharmD

## 2016-10-21 NOTE — Telephone Encounter (Signed)
Can get application for assistance from [email or web address].  Patient will need to prove spending $300 on prescription medications for 2018 so far.

## 2016-10-22 NOTE — Telephone Encounter (Signed)
Pt notified to get form on computer Explained processes for patient care assistance he will call back if anything else is needed he will call back if samples are needed before approval process is complete/denied.

## 2017-02-06 ENCOUNTER — Telehealth: Payer: Self-pay | Admitting: Cardiovascular Disease

## 2017-02-06 NOTE — Telephone Encounter (Signed)
New Message      Patient calling the office for samples of medication:   1.  What medication and dosage are you requesting samples for? Eliquis 10 mg   2.  Are you currently out of this medication? Not yet he is 9 days short of getting through the end of the year and the donut hole

## 2017-02-06 NOTE — Telephone Encounter (Signed)
Eliquis 5 mg #2 Lot #[id number] exp 3/21 Patient aware

## 2017-02-27 ENCOUNTER — Other Ambulatory Visit: Payer: Self-pay | Admitting: Cardiovascular Disease

## 2017-03-31 ENCOUNTER — Ambulatory Visit: Payer: Medicare Other | Admitting: Cardiovascular Disease

## 2017-03-31 ENCOUNTER — Encounter: Payer: Self-pay | Admitting: Cardiovascular Disease

## 2017-03-31 VITALS — BP 111/62 | HR 64 | Resp 16 | Ht 74.0 in | Wt 188.8 lb

## 2017-03-31 DIAGNOSIS — E7800 Pure hypercholesterolemia, unspecified: Secondary | ICD-10-CM

## 2017-03-31 DIAGNOSIS — Z7901 Long term (current) use of anticoagulants: Secondary | ICD-10-CM | POA: Diagnosis not present

## 2017-03-31 DIAGNOSIS — I519 Heart disease, unspecified: Secondary | ICD-10-CM

## 2017-03-31 DIAGNOSIS — I5189 Other ill-defined heart diseases: Secondary | ICD-10-CM

## 2017-03-31 DIAGNOSIS — I480 Paroxysmal atrial fibrillation: Secondary | ICD-10-CM

## 2017-03-31 NOTE — Progress Notes (Signed)
Patient ID: Gabriel Schmidt, male   DOB: 1934/11/29, 83 y.o.   MRN: [id number]    Primary M.D.: Dr. Gus Height  HPI: Gabriel Schmidt is a 83 y.o. male who presents to the office today for a 12 month follow up cardiology evaluation.  Mr. Headen has a history of PAF with 2 previously documented episodes in 2006 and in February 2008 prior to the past 6 months  He had been on combination therapy with Rythmol as well as low-dose beta blocker therapy.  He had been maintained on Coumadin and this was ultimately discontinued in September 2011.  He also has a history of hyperlipidemia for which she's been treated with Crestor 10 mg, mitral valve prolapse with mild MR, and remotely had noted some fatigability.  I last saw him in March 2014 at which time he was doing well on current therapy.  He apparently had seen Lesia Hausen in June 2015 and at that time expressed to her his wishes to discontinue Rythmol if possible.  She did not feel comfortable making that decision.  When I  saw him in February 2017, I recommended that he try to wean off Rythmol which he had done over a three-week period.  Since that time, he has been on atenolol 25 mg daily in addition to aspirin 81 mg, omega-3 fatty acids, and Crestor 10 mg.  He is unaware of any breakthrough rhythm disturbance.  He continues to remain stable.  In the past the episodes of paroxysmal atrial fibrillation occurred in the setting of more significant alcohol intake.  When I last saw him in May, he was feeling great and was  exercising regularly.  He works out 5 days per week.  He has remained in good physical shape.  He denies any weight gain.  He denies chest pain.  He denies shortness of breath.  He denies bleeding.  When I saw him, he had 2 recurrent episodes of atrial fibrillation.  The first was in 10/24/2014 when he developed atrial fibrillation while working out.  He presented to the emergency room  and was given eliquis and successfully cardioverted that  day back to sinus rhythm.  He went to the atrial fibrillation clinic in follow-up.  He had a cha2ds2vasc score of 2.  He was felt that he did not require daily medication at that point, particularly with a somewhat low blood pressure.  When he had developed atrial fibrillation with a rapid ventricular response he was hypotensive.  He had a recurrent episode on 03/02/2015 presented to the emergency room with recurrent onset of atrial fibrillation.  Eliquis was reinstituted since he had only taken it for a month after his August cardioversion and he again was successfully cardioverted back to sinus rhythm.  When I  saw him in January 2017 because of his recurrent episodes, I recommended initiation of flecainide 50 mg twice a day.  He has tolerated this well and has not had any recurrent episodes.  He underwent an echo Doppler study in January 2017 which showed an EF of 60-65% with grade 2 diastolic dysfunction.  There was mild biatrial enlargement.  PA pressure was 34 mm.  There was trivial MR and TR.  He underwent a routine treadmill test on flexion.  I did make certain he did not have any exercise-induced arrhythmia.  This was normal.  He exercised for 9 minutes and did not develop chest pain or ECG changes.  Since I last saw him in January 2018, he has  remained stable.  He is unaware of any recurrent episodes of atrial fibrillation.  He continues to be active.  He cuts down trees and goes to the gym regularly.  He has continued to be on flecanide 50 mg twice a day and atenolol 12.5 mg twice a day.  He is on eliquis 5 mg twice a day.  He denies bleeding.  He continues to be on rosuvastatin 10 mg.  Laboratory from her 2018 by his primary physician had shown an LDL of 80.  He tells me he will be having repeat laboratory one to 2 months.  He denies chest pain.  He denies PND, orthopnea.  He denies palpitations.  His sleep is excellent.  He will be going on a cruise to [place] in the spring.  He presents for  reevaluation.   Past Medical History:  Diagnosis Date  . Fatigue   . Hyperlipidemia   . Mitral valve prolapse   . Paroxysmal A-fib [facility])     Past Surgical History:  Procedure Laterality Date  . CARDIAC CATHETERIZATION  06/19/2004  . CARDIOVERSION  04/14/2006  . TONSILLECTOMY AND ADENOIDECTOMY      Allergies  Allergen Reactions  . Penicillins Rash    Has patient had a PCN reaction causing immediate rash, facial/tongue/throat swelling, SOB or lightheadedness with hypotension: No Has patient had a PCN reaction causing severe rash involving mucus membranes or skin necrosis: No Has patient had a PCN reaction that required hospitalization No Has patient had a PCN reaction occurring within the last 10 years: No If all of the above answers are "NO", then may proceed with Cephalosporin use.    Current Outpatient Medications  Medication Sig Dispense Refill  . apixaban (ELIQUIS) 5 MG TABS tablet Take 1 tablet (5 mg total) by mouth 2 (two) times daily. 60 tablet 11  . atenolol (TENORMIN) 25 MG tablet Take 0.5 tablets (12.5 mg total) by mouth 2 (two) times daily. 90 tablet 3  . calcium carbonate (TUMS - DOSED IN MG ELEMENTAL CALCIUM) 500 MG chewable tablet Chew 1 tablet by mouth 2 (two) times daily as needed for indigestion or heartburn. Reported on 03/09/2015    . flecainide (TAMBOCOR) 50 MG tablet Take 1 tablet (50 mg total) by mouth 2 (two) times daily. 10 tablet 0  . rosuvastatin (CRESTOR) 10 MG tablet TAKE 1 TABLET BY MOUTH  DAILY 90 tablet 0   No current facility-administered medications for this visit.     Social History   Socioeconomic History  . Marital status: Married    Spouse name: Not on file  . Number of children: Not on file  . Years of education: Not on file  . Highest education level: Not on file  Social Needs  . Financial resource strain: Not on file  . Food insecurity - worry: Not on file  . Food insecurity - inability: Not on file  . Transportation needs -  medical: Not on file  . Transportation needs - non-medical: Not on file  Occupational History  . Not on file  Tobacco Use  . Smoking status: Never Smoker  . Smokeless tobacco: Never Used  Substance and Sexual Activity  . Alcohol use: Yes    Alcohol/week: 1.8 - 2.4 oz    Types: 3 - 4 Standard drinks or equivalent per week  . Drug use: No  . Sexual activity: Not on file  Other Topics Concern  . Not on file  Social History Narrative  . Not on file  Socially he is retired.  He has 3 children.  He previously was a [employer].  Family history is notable that both parents are deceased.  Father died of a stroke.  ROS General: Negative; No fevers, chills, or night sweats HEENT: Negative; No changes in vision or hearing, sinus congestion, difficulty swallowing Pulmonary: Negative; No cough, wheezing, shortness of breath, hemoptysis Cardiovascular: See HPI: No chest pain, presyncope, syncope, palpatations GI: Negative; No nausea, vomiting, diarrhea, or abdominal pain GU: Negative; No dysuria, hematuria, or difficulty voiding Musculoskeletal: Negative; no myalgias, joint pain, or weakness Hematologic: Negative; no easy bruising, bleeding Endocrine: Negative; no heat/cold intolerance; no diabetes, Neuro: Negative; no changes in balance, headaches Skin: Negative; No rashes or skin lesions Psychiatric: Negative; No behavioral problems, depression Sleep: Negative; No snoring,  daytime sleepiness, hypersomnolence, bruxism, restless legs, hypnogognic hallucinations. Other comprehensive 14 point system review is negative   Physical Exam BP 111/62   Pulse 64   Resp 16   Ht '6\' 2"'  (1.88 m)   Wt 188 lb 12.8 oz (85.6 kg)   SpO2 94%   BMI 24.24 kg/m    Repeat blood pressure by me was 118/70  Wt Readings from Last 3 Encounters:  03/31/17 188 lb 12.8 oz (85.6 kg)  03/25/16 186 lb 6 oz (84.5 kg)  05/16/15 180 lb (81.6 kg)   General: Alert, oriented, no distress.  Skin: normal  turgor, no rashes, warm and dry HEENT: Normocephalic, atraumatic. Pupils equal round and reactive to light; sclera anicteric; extraocular muscles intact;  Nose without nasal septal hypertrophy Mouth/Parynx benign; Mallinpatti scale 2 Neck: No JVD, no carotid bruits; normal carotid upstroke Lungs: clear to ausculatation and percussion; no wheezing or rales Chest wall: without tenderness to palpitation Heart: PMI not displaced, RRR, s1 s2 normal, 1/6 systolic murmur, no diastolic murmur, no rubs, gallops, thrills, or heaves Abdomen: soft, nontender; no hepatosplenomehaly, BS+; abdominal aorta nontender and not dilated by palpation. Back: no CVA tenderness Pulses 2+ Musculoskeletal: full range of motion, normal strength, no joint deformities Extremities: no clubbing cyanosis or edema, Homan's sign negative  Neurologic: grossly nonfocal; Cranial nerves grossly wnl Psychologic: Normal mood and affect  ECG (independently read by me): Sinus bradycardia 58 bpm.  One isolated PVC.  QTc interval 443 ms.  January 2018 ECG (independently read by me): Normal sinus rhythm at 64 bpm.  QTc interval 427 ms.  PR interval 194 ms.  No significant ST-T changes.  March 2017 ECG (independently read by me): Sinus bradycardia 58 bpm.  QTc interval 418 ms.  No significant ST segment changes.  January 2017 ECG (independently read by me): Sinus bradycardia at 51 bpm.  PR interval 180 ms; QTc interval 41 ms.  May 2016 ECG (independently read by me): Sinus bradycardia 59 bpm.  QTc interval 423 ms.  PR interval 188 ms  February 2016 ECG (independently read by me): Sinus bradycardia 59 bpm.  Borderline first-degree AV block with a PR interval of 24 ms.  No significant ST segment changes.  LABS:  BMP Latest Ref Rng & Units 03/01/2015 10/24/2014 07/13/2014  Glucose 65 - 99 mg/dL 99 114(H) 92  BUN 6 - 20 mg/dL '13 16 14  ' Creatinine 0.61 - 1.24 mg/dL 1.18 1.21 0.98  BUN/Creat Ratio 10 - 22 - - 14  Sodium 135 - 145 mmol/L  142 140 142  Potassium 3.5 - 5.1 mmol/L 4.7 4.2 5.0  Chloride 101 - 111 mmol/L 105 106 103  CO2 22 - 32 mmol/L 30 28 24  Calcium 8.9 - 10.3 mg/dL 9.4 9.1 9.1     Hepatic Function Latest Ref Rng & Units 07/13/2014 08/02/2013  Total Protein 6.0 - 8.5 g/dL 5.9(L) 6.1  Albumin 3.5 - 4.8 g/dL 4.0 4.1  AST 0 - 40 IU/L 20 15  ALT 0 - 44 IU/L 11 10  Alk Phosphatase 39 - 117 IU/L 47 41  Total Bilirubin 0.0 - 1.2 mg/dL 1.0 1.4(H)     CBC Latest Ref Rng & Units 03/01/2015 10/24/2014 07/13/2014  WBC 4.0 - 10.5 K/uL 6.4 4.9 4.8  Hemoglobin 13.0 - 17.0 g/dL 15.1 15.2 15.0  Hematocrit 39.0 - 52.0 % 46.1 45.7 43.9  Platelets 150 - 400 K/uL 279 260 263   Lab Results  Component Value Date   MCV 89.5 03/01/2015   MCV 90.5 10/24/2014   MCV 90 07/13/2014   Lab Results  Component Value Date   TSH 1.700 07/13/2014    BNP No results found for: BNP  ProBNP No results found for: PROBNP   Lipid Panel     Component Value Date/Time   CHOL 141 07/13/2014 0923   TRIG 78 07/13/2014 0923   HDL 64 07/13/2014 0923   CHOLHDL 2.7 08/02/2013 0835   VLDL 17 08/02/2013 0835   LDLCALC 61 07/13/2014 0923     RADIOLOGY: No results found.  IMPRESSION:  1. PAF (paroxysmal atrial fibrillation) ([place])   2. Anticoagulation adequate   3. Pure hypercholesterolemia   4. Grade II diastolic dysfunction      ASSESSMENT AND PLAN: Mr. Donald Hinger is a young appearing 83 year old white male who has a history of paroxysmal atrial fibrillation and had only experienced only 2 prior episodes of atrial fibrillation in over 10 year period.  Remotely, he had experienced a previous episode that was short lived while [place] in [place], [place].  He had no further recurrence for over 8 years and in 2016 he requested to be taken off his antiarrhythmic therapy and since he had been so stable Rythmol was discontinued.  He developed several recurrent episodes of atrial fibrillation on antiarrhythmic therapy since August 2016  and underwent cardioversion.  He is now on Eliquis for anticoagulation.  Since initiating flecanide, he has remained stable and is unaware of any palpitations or arrhythmia.  He continues to be very active.  His blood pressure today is stable on atenolol 12.5 mg twice a day.  His ECG reveals sinus bradycardia with one isolated PVC.  He is on flecanide 50 mg twice a day; QTC remains normal at 443 ms.  An exercise treadmill test which was done to make certain he did not have any flecanide induced proarrhythmia and this was stable.  He is not having any anginal type symptoms.  In February 2018, LDL cholesterol was 80 on rosuvastatin 10 mg.  He will be undergoing repeat blood work by his primary physician in the next month.  I have suggested that if his LDL is greater than 70 that he consider increasing rosuvastatin 20 mg.  I reviewed his recent data regarding subclinical atherosclerosis development as well as data regarding plaque regression.  He continues to exercise regularly.  He is unaware of the isolated PVC that was noted on his ECG.  His prior echo did reveal grade 2 diastolic dysfunction and mitral annular calcification.  As long as he remains stable I will see him in one-year for reevaluation  Time spent: 25 minutes Troy Sine, MD, [facility]  03/31/2017 9:10 AM

## 2017-03-31 NOTE — Patient Instructions (Signed)
Medication Instructions:  Your physician recommends that you continue on your current medications as directed. Please refer to the Current Medication list given to you today.  Follow-Up: Your physician wants you to follow-up in: 12 months with Dr. Kelly.  You will receive a reminder letter in the mail two months in advance. If you don't receive a letter, please call our office to schedule the follow-up appointment.   If you need a refill on your cardiac medications before your next appointment, please call your pharmacy.   

## 2017-04-01 ENCOUNTER — Other Ambulatory Visit: Payer: Self-pay | Admitting: Cardiovascular Disease

## 2017-04-03 ENCOUNTER — Other Ambulatory Visit: Payer: Self-pay | Admitting: Cardiovascular Disease

## 2017-04-03 NOTE — Telephone Encounter (Signed)
REFILL 

## 2017-04-07 ENCOUNTER — Telehealth: Payer: Self-pay | Admitting: Cardiovascular Disease

## 2017-04-07 MED ORDER — ROSUVASTATIN CALCIUM 10 MG PO TABS: 10.0000 mg | ORAL_TABLET | Freq: Every day | ORAL | 3 refills | Status: DC

## 2017-04-07 NOTE — Telephone Encounter (Signed)
Verbal Rx given to [facility]. Lipid and liver panel done 03/2016 printed from [facility] and will be scanned in [place]

## 2017-04-07 NOTE — Telephone Encounter (Signed)
[employer] ) is calling  to get a verbal Auth for Crestor 10 mg . Please call at [phone] and use reference  #[id number]. Thanks

## 2017-04-28 ENCOUNTER — Other Ambulatory Visit: Payer: Self-pay | Admitting: Cardiovascular Disease

## 2017-07-02 ENCOUNTER — Telehealth: Payer: Self-pay | Admitting: Cardiovascular Disease

## 2017-07-02 NOTE — Telephone Encounter (Signed)
New Message:    Pt is going on a Cruise. He wants to know what should he do if he have an Afib attack and also can he take Dramamine?

## 2017-07-02 NOTE — Telephone Encounter (Signed)
Pt states the he will be going on a cruise and wanted to know what he needed to do if he has an episode of symptomatic afib. Pt also wanted to verify if its ok for him to take Dramamine. Routed to Dr. Claiborne Billings

## 2017-07-02 NOTE — Telephone Encounter (Signed)
He can take an extra atenolol to see if this improves.  If continues, can try increasing flecainide to 75 mg twice a day but will need a follow-up ECG within a week.  Okay to use Dramamine

## 2017-07-02 NOTE — Telephone Encounter (Signed)
Left message to call back  

## 2017-07-03 NOTE — Telephone Encounter (Signed)
Pt updated with Dr. Kelly's recommendation. Pt verbalized understanding.  

## 2017-07-03 NOTE — Telephone Encounter (Signed)
Left message to call back  

## 2017-08-20 ENCOUNTER — Telehealth: Payer: Self-pay | Admitting: Cardiovascular Disease

## 2017-08-20 NOTE — Telephone Encounter (Signed)
Pt aware needs to notify MD doing the procedure for any directions prior to procedure . Pt verbalized understanding ./cy

## 2017-08-20 NOTE — Telephone Encounter (Signed)
New Message:       Pt c/o medication issue:  1. Name of Medication: ELIQUIS 5 MG TABS tablet  2. How are you currently taking this medication (dosage and times per day)? TAKE 1 TABLET BY MOUTH TWICE DAILY  3. Are you having a reaction (difficulty breathing--STAT)? No  4. What is your medication issue? Pt states he if having a mole removed from his ear tomorrow and was wondering should he still take this medication

## 2017-10-21 ENCOUNTER — Other Ambulatory Visit: Payer: Self-pay | Admitting: Pharmacist

## 2017-10-21 MED ORDER — APIXABAN 5 MG PO TABS: 5.0000 mg | ORAL_TABLET | Freq: Two times a day (BID) | ORAL | 5 refills | Status: DC

## 2017-10-21 NOTE — Telephone Encounter (Signed)
BMET @ [place]

## 2018-05-05 ENCOUNTER — Other Ambulatory Visit: Payer: Self-pay | Admitting: Cardiovascular Disease

## 2018-05-05 ENCOUNTER — Telehealth: Payer: Self-pay | Admitting: Cardiovascular Disease

## 2018-05-05 NOTE — Telephone Encounter (Signed)
Spoke to [place] -  Patient and wife are leaving for [place] 05/11/18 -driving On 7/59/16 going to [place]  Dr Claiborne Billings not in office will contact once response is given

## 2018-05-05 NOTE — Telephone Encounter (Signed)
  Patient has plans to travel to [place] and wants to know if Dr Claiborne Billings thinks it is safe to travel.

## 2018-05-05 NOTE — Telephone Encounter (Signed)
Will speak with Dr.Kelly when he returns to office.

## 2018-05-07 NOTE — Telephone Encounter (Signed)
It may not be the best time to travel, and new restrictions will take effect.

## 2018-05-07 NOTE — Telephone Encounter (Signed)
Left message to call back  

## 2018-05-08 NOTE — Telephone Encounter (Signed)
Patient called back, made patient aware of Dr. Evette Georges advice below.

## 2018-05-18 ENCOUNTER — Other Ambulatory Visit: Payer: Self-pay | Admitting: Cardiovascular Disease

## 2018-05-18 MED ORDER — ATENOLOL 25 MG PO TABS: 12.5000 mg | ORAL_TABLET | Freq: Two times a day (BID) | ORAL | 5 refills | Status: DC

## 2018-05-18 MED ORDER — ATENOLOL 25 MG PO TABS: 12.5000 mg | ORAL_TABLET | Freq: Two times a day (BID) | ORAL | 1 refills | Status: DC

## 2018-05-18 NOTE — Addendum Note (Signed)
Addended by: Venetia Maxon on: 05/18/2018 12:04 PM   Modules accepted: Orders

## 2018-05-18 NOTE — Telephone Encounter (Signed)
New Message    *STAT* If patient is at the pharmacy, call can be transferred to refill team.   1. Which medications need to be refilled? (please list name of each medication and dose if known) atenolol 25mg    2. Which pharmacy/location (including street and city if local pharmacy) is medication to be sent to? [facility] on [address] [place]   3. Do they need a 30 day or 90 day supply? 30 day supply

## 2018-05-19 ENCOUNTER — Telehealth: Payer: Self-pay | Admitting: Cardiovascular Disease

## 2018-05-19 NOTE — Telephone Encounter (Signed)
°*  STAT* If patient is at the pharmacy, call can be transferred to refill team.   1. Which medications need to be refilled? (please list name of each medication and dose if known)  atenolol (TENORMIN) 25 MG tablet  2. Which pharmacy/location (including street and city if local pharmacy) is medication to be sent to? [place] [phone]    3. Do they need a 30 day or 90 day supply? 66  Pt says he contacted the pharmacy about the refill, but the pharmacy did not have any record of the request.  PT is out of medication

## 2018-06-02 ENCOUNTER — Telehealth: Payer: Self-pay

## 2018-06-02 NOTE — Telephone Encounter (Signed)
YOUR CARDIOLOGY TEAM HAS ARRANGED FOR AN E-VISIT FOR YOUR APPOINTMENT - PLEASE REVIEW IMPORTANT INFORMATION BELOW SEVERAL DAYS PRIOR TO YOUR APPOINTMENT  Due to the recent COVID-19 pandemic, we are transitioning in-person office visits to tele-medicine visits in an effort to decrease unnecessary exposure to our patients and staff. Medicare and most insurances are covering these visits without a copay needed. We also encourage you to sign up for MyChart if you have not already done so. You will need a smartphone if possible. For patients that do not have this, we can still complete the visit using a regular telephone but do prefer a smartphone to enable video when possible. You may have a close family member that lives with you that can help. If possible, we also ask that you have a blood pressure cuff and scale at home to measure your blood pressure, heart rate and weight prior to your scheduled appointment. Patients with clinical needs that need an in-person evaluation and testing will still be able to come to the office if absolutely necessary. If you have any questions, feel free to call our office.    IF YOU HAVE A SMARTPHONE, PLEASE DOWNLOAD THE WEBEX APP TO YOUR SMARTPHONE  - If Apple, go to App Store and type in WebEx in the search bar. Download [employer] Webex Meetings, the blue/green circle. The app is free but as with any other app download, your phone may require you to verify saved payment information or Apple password. You do NOT have to create a WebEx account.  - If Android, go to Google Play Store and type in WebEx in the search bar. Download [employer] Webex Meetings, the blue/green circle. The app is free but as with any other app download, your phone may require you to verify saved payment information or Android password. You do NOT have to create a WebEx account.  It is very helpful to have this downloaded before your visit.    2-3 DAYS BEFORE YOUR APPOINTMENT  You will receive a  telephone call from one of our [facility] team members - your caller ID may say "Unknown caller." If this is a video visit, we will confirm that you have been able to download the WebEx app. We will remind you check your blood pressure, heart rate and weight prior to your scheduled appointment. If you have an Apple Watch or Kardia, please upload any pertinent ECG strips the day before or morning of your appointment to MyChart. Our staff will also make sure you have reviewed the consent and agree to move forward with your scheduled tele-health visit.     THE DAY OF YOUR APPOINTMENT  Approximately 15 minutes prior to your scheduled appointment, you will receive a telephone call from one of HeartCare team - your caller ID may say "Unknown caller."  Our staff will confirm medications, vital signs for the day and any symptoms you may be experiencing. Please have this information available prior to the time of visit start. It may also be helpful for you to have a pad of paper and pen handy for any instructions given during your visit. They will also walk you through joining the WebEx smartphone meeting if this is a video visit.    CONSENT FOR TELE-HEALTH VISIT - PLEASE REVIEW  I hereby voluntarily request, consent and authorize [facility] and its employed or contracted physicians, physician assistants, nurse practitioners or other licensed health care professionals (the Practitioner), to provide me with telemedicine health care services (the "Services") as   deemed necessary by the treating Practitioner. I acknowledge and consent to receive the Services by the Practitioner via telemedicine. I understand that the telemedicine visit will involve communicating with the Practitioner through live audiovisual communication technology and the disclosure of certain medical information by electronic transmission. I acknowledge that I have been given the opportunity to request an in-person assessment or other available  alternative prior to the telemedicine visit and am voluntarily participating in the telemedicine visit.  I understand that I have the right to withhold or withdraw my consent to the use of telemedicine in the course of my care at any time, without affecting my right to future care or treatment, and that the Practitioner or I may terminate the telemedicine visit at any time. I understand that I have the right to inspect all information obtained and/or recorded in the course of the telemedicine visit and may receive copies of available information for a reasonable fee.  I understand that some of the potential risks of receiving the Services via telemedicine include:  Marland Kitchen Delay or interruption in medical evaluation due to technological equipment failure or disruption; . Information transmitted may not be sufficient (e.g. poor resolution of images) to allow for appropriate medical decision making by the Practitioner; and/or  . In rare instances, security protocols could fail, causing a breach of personal health information.  Furthermore, I acknowledge that it is my responsibility to provide information about my medical history, conditions and care that is complete and accurate to the best of my ability. I acknowledge that Practitioner's advice, recommendations, and/or decision may be based on factors not within their control, such as incomplete or inaccurate data provided by me or distortions of diagnostic images or specimens that may result from electronic transmissions. I understand that the practice of medicine is not an exact science and that Practitioner makes no warranties or guarantees regarding treatment outcomes. I acknowledge that I will receive a copy of this consent concurrently upon execution via email to the email address I last provided but may also request a printed copy by calling the office of [place].    I understand that my insurance will be billed for this visit.   I have read or had  this consent read to me. . I understand the contents of this consent, which adequately explains the benefits and risks of the Services being provided via telemedicine.  . I have been provided ample opportunity to ask questions regarding this consent and the Services and have had my questions answered to my satisfaction. . I give my informed consent for the services to be provided through the use of telemedicine in my medical care  By participating in this telemedicine visit I agree to the above. EE

## 2018-06-02 NOTE — Telephone Encounter (Signed)
TELEPHONE CALL NOTE  Lestat Golob Deblasi has been deemed a candidate for a follow-up tele-health visit to limit community exposure during the Covid-19 pandemic. I spoke with the patient via phone to ensure availability of phone/video source, confirm preferred email & phone number, and discuss instructions and expectations.  I reminded Daire Okimoto Pitzer to be prepared with any vital sign and/or heart rhythm information that could potentially be obtained via home monitoring, at the time of his visit. I reminded Idriss Quackenbush Frenette to expect a phone call at the time of his visit if his visit.  Did the patient verbally acknowledge consent to treatment? Patient provided verbal consent.  Mitzie Na, [place] 06/02/2018 1:38 PM   DOWNLOADING THE [place], go to [employer] and type in WebEx in the search bar. [place] [employer], the blue/green circle. The app is free but as with any other app downloads, their phone may require them to verify saved payment information or Apple password. The patient does NOT have to create an account.  - If Android, ask patient to go to [employer] and type in WebEx in the search bar. [place] [employer], the blue/green circle. The app is free but as with any other app downloads, their phone may require them to verify saved payment information or Android password. The patient does NOT have to create an account.   CONSENT FOR TELE-HEALTH VISIT - PLEASE REVIEW  I hereby voluntarily request, consent and authorize [facility] and its employed or contracted physicians, physician assistants, nurse practitioners or other licensed health care professionals (the Practitioner), to provide me with telemedicine health care services (the "Services") as deemed necessary by the treating Practitioner. I acknowledge and consent to receive the Services by the Practitioner via telemedicine. I understand that the telemedicine visit will  involve communicating with the Practitioner through live audiovisual communication technology and the disclosure of certain medical information by electronic transmission. I acknowledge that I have been given the opportunity to request an in-person assessment or other available alternative prior to the telemedicine visit and am voluntarily participating in the telemedicine visit.  I understand that I have the right to withhold or withdraw my consent to the use of telemedicine in the course of my care at any time, without affecting my right to future care or treatment, and that the Practitioner or I may terminate the telemedicine visit at any time. I understand that I have the right to inspect all information obtained and/or recorded in the course of the telemedicine visit and may receive copies of available information for a reasonable fee.  I understand that some of the potential risks of receiving the Services via telemedicine include:  Marland Kitchen Delay or interruption in medical evaluation due to technological equipment failure or disruption; . Information transmitted may not be sufficient (e.g. poor resolution of images) to allow for appropriate medical decision making by the Practitioner; and/or  . In rare instances, security protocols could fail, causing a breach of personal health information.  Furthermore, I acknowledge that it is my responsibility to provide information about my medical history, conditions and care that is complete and accurate to the best of my ability. I acknowledge that Practitioner's advice, recommendations, and/or decision may be based on factors not within their control, such as incomplete or inaccurate data provided by me or distortions of diagnostic images or specimens that may result from electronic transmissions. I understand that the practice  of medicine is not an Chief Strategy Officer and that Practitioner makes no warranties or guarantees regarding treatment outcomes. I acknowledge that  I will receive a copy of this consent concurrently upon execution via email to the email address I last provided but may also request a printed copy by calling the office of [place].    I understand that my insurance will be billed for this visit.   I have read or had this consent read to me. . I understand the contents of this consent, which adequately explains the benefits and risks of the Services being provided via telemedicine.  . I have been provided ample opportunity to ask questions regarding this consent and the Services and have had my questions answered to my satisfaction. . I give my informed consent for the services to be provided through the use of telemedicine in my medical care  By participating in this telemedicine visit I agree to the above.

## 2018-06-08 NOTE — Telephone Encounter (Signed)
Mychart, smartphone, pre reg complete 06/08/18 AF

## 2018-06-10 ENCOUNTER — Encounter: Payer: Self-pay | Admitting: Cardiovascular Disease

## 2018-06-10 ENCOUNTER — Telehealth (INDEPENDENT_AMBULATORY_CARE_PROVIDER_SITE_OTHER): Payer: Medicare Other | Admitting: Cardiovascular Disease

## 2018-06-10 ENCOUNTER — Ambulatory Visit: Payer: Medicare Other | Admitting: Cardiovascular Disease

## 2018-06-10 VITALS — BP 123/64 | HR 65 | Ht 74.0 in | Wt 180.0 lb

## 2018-06-10 DIAGNOSIS — I480 Paroxysmal atrial fibrillation: Secondary | ICD-10-CM

## 2018-06-10 DIAGNOSIS — E7800 Pure hypercholesterolemia, unspecified: Secondary | ICD-10-CM

## 2018-06-10 DIAGNOSIS — I5189 Other ill-defined heart diseases: Secondary | ICD-10-CM

## 2018-06-10 DIAGNOSIS — Z7901 Long term (current) use of anticoagulants: Secondary | ICD-10-CM

## 2018-06-10 DIAGNOSIS — I519 Heart disease, unspecified: Secondary | ICD-10-CM

## 2018-06-10 NOTE — Patient Instructions (Signed)

## 2018-06-10 NOTE — Progress Notes (Signed)
Virtual Visit via Video Note   This visit type was conducted due to national recommendations for restrictions regarding the COVID-19 Pandemic (e.g. social distancing) in an effort to limit this patient's exposure and mitigate transmission in our community.  Due to his co-morbid illnesses, this patient is at least at moderate risk for complications without adequate follow up.  This format is felt to be most appropriate for this patient at this time.  All issues noted in this document were discussed and addressed.  A limited physical exam was performed with this format.  Please refer to the patient's chart for his consent to telehealth for [facility].   Evaluation Performed:  Follow-up visit  Date:  06/10/2018   ID:  Gabriel Schmidt, DOB 01-21-1935, MRN [id number]  Patient location: Home Provider location: Home  PCP:  Gabriel Cruel, MD  Cardiologist:  Gabriel Majestic, MD Electrophysiologist:  None   Chief Complaint: 48-month follow-up evaluation of PAF  History of Present Illness:    Gabriel Schmidt is an 84 year old gentleman who has a history of PAF with 2 initially documented episodes in 2006 and in February 2008.  He had been on combination therapy with Rythmol as well as low-dose beta blocker therapy.  He had been maintained on Coumadin and this was ultimately discontinued in September 2011.  He also has a history of hyperlipidemia treated with Crestor 10 mg, mitral valve prolapse with mild MR, and remotely had noted some fatigability. He saw Gabriel Schmidt in June 2015 and at that time expressed to her his wishes to discontinue Rythmol if possible.  She did not feel comfortable making that decision.  When I  saw him in February 2017, he again expresses desire to discontinue Rythmol and had not had any recurrent episodes of atrial fibrillation for over 9 years.  His initial episode had occurred while on vacation when he was having more alcohol than normally.  I recommended that he try  to wean off Rythmol which he had done over a three-week period.  Since that time, he has been on atenolol 25 mg daily in addition to aspirin 81 mg, omega-3 fatty acids, and Crestor 10 mg.  He is unaware of any breakthrough rhythm disturbance.  He continues to remain stable.  In the past the episodes of paroxysmal atrial fibrillation occurred in the setting of more significant alcohol intake.   He subsequently had 2 recurrent episodes of atrial fibrillation.  The first was in 10/24/2014 when he developed atrial fibrillation while working out.  He presented to the emergency room  and was given eliquis and successfully cardioverted that day back to sinus rhythm.  He went to the atrial fibrillation clinic in follow-up.  He had a cha2ds2vasc score of 2.  He was felt that he did not require daily medication at that point, particularly with a somewhat low blood pressure.  When he had developed atrial fibrillation with a rapid ventricular response he was hypotensive.  He had a recurrent episode on 03/02/2015 presented to the emergency room with recurrent onset of atrial fibrillation.  Eliquis was reinstituted since he had only taken it for a month after his August cardioversion and he again was successfully cardioverted back to sinus rhythm.  When I  saw him in January 2017 because of his recurrent episodes, I recommended initiation of flecainide 50 mg twice a day.  He has tolerated this well and has not had any recurrent episodes.  He underwent an echo Doppler study in January 2017  which showed an EF of 60-65% with grade 2 diastolic dysfunction.  There was mild biatrial enlargement.  PA pressure was 34 mm.  There was trivial MR and TR.  He underwent a routine treadmill test on flecanide.  I did make certain he did not have any exercise-induced arrhythmia.  This was normal.  He exercised for 9 minutes and did not develop chest pain or ECG changes.  I saw him in January 2018, and last saw him in February 2019.  At  that time he was remaining stable and was unaware of any recurrent episodes of atrial fibrillation.    Over the past year, he has continued to be on flecainide 50 mg twice a day, atenolol 12.5 mg daily, Eliquis 5 mg twice a day, and rosuvastatin 10 mg.  He remains very active and exercises  regularly works in the yard and cuts down trees.  Last year he had gone on a cruise to [place].  He was planning to do a cruise at the end of the summer this year but this has been canceled.  He has had purposeful weight loss of approximately 5 to 8 pounds since the new year.  He denies any chest pain, PND, orthopnea, and denies any palpitations.  He is unaware of any recurrent atrial fibrillation issues.  He will be seeing his primary physician Gabriel Schmidt and laboratory will be obtained by his primary physician.  He tells me he will send these results to me when complete.   The patient doe not have symptoms concerning for COVID-19 infection (fever, chills, cough, or new shortness of breath).    Past Medical History:  Diagnosis Date  . Fatigue   . Hyperlipidemia   . Mitral valve prolapse   . Paroxysmal A-fib [facility])    Past Surgical History:  Procedure Laterality Date  . CARDIAC CATHETERIZATION  06/19/2004  . CARDIOVERSION  04/14/2006  . TONSILLECTOMY AND ADENOIDECTOMY       Current Meds  Medication Sig  . atenolol (TENORMIN) 25 MG tablet Take 0.5 tablets (12.5 mg total) by mouth 2 (two) times daily.  . calcium carbonate (TUMS - DOSED IN MG ELEMENTAL CALCIUM) 500 MG chewable tablet Chew 1 tablet by mouth 2 (two) times daily as needed for indigestion or heartburn. Reported on 03/09/2015  . ELIQUIS 5 MG TABS tablet TAKE 1 TABLET(5 MG) BY MOUTH TWICE DAILY  . flecainide (TAMBOCOR) 50 MG tablet TAKE 1 TABLET BY MOUTH TWO  TIMES DAILY  . rosuvastatin (CRESTOR) 10 MG tablet Take 1 tablet (10 mg total) by mouth daily.     Allergies:   Penicillins   Social History   Tobacco Use  . Smoking status: Never  Smoker  . Smokeless tobacco: Never Used  Substance Use Topics  . Alcohol use: Yes    Alcohol/week: 3.0 - 4.0 standard drinks    Types: 3 - 4 Standard drinks or equivalent per week  . Drug use: No     Family Hx: The patient's family history includes Stroke in his father.  ROS:   Please see the history of present illness.    No chest pain, PND orthopnea, palpitations, presyncope or syncope.  All other systems reviewed and are negative.   Prior CV studies:   The following studies were reviewed today:  ------------------------------------------------------------------- ECHO Study Conclusions: 03/09/2015  - Left ventricle: The cavity size was normal. Wall thickness was   normal. Systolic function was normal. The estimated ejection   fraction was in the range of  60% to 65%. Wall motion was normal;   there were no regional wall motion abnormalities. Features are   consistent with a pseudonormal left ventricular filling pattern,   with concomitant abnormal relaxation and increased filling   pressure (grade 2 diastolic dysfunction). - Mitral valve: Calcified annulus. - Left atrium: The atrium was mildly dilated. - Right atrium: The atrium was mildly dilated. - Pulmonary arteries: Systolic pressure was mildly increased. PA   peak pressure: 34 mm Hg (S).  Impressions:  - Normal LV systolic function; grade 2 diastolic dysfunction; mild   biatrial enlargement; trace MR and TR; mildly elevated pulmonary   pressure.  Labs/Other Tests and Data Reviewed:    I have personally reviewed his last ECG from March 31, 2017 which showed sinus bradycardia at 58 bpm, an isolated PVC.  QT C interval was 443.  I also reviewed his prior ECGs which have consistently shown a normal QTc interval  Recent Labs: No results found for requested labs within last 8760 hours.   Recent Lipid Panel Lab Results  Component Value Date/Time   CHOL 141 07/13/2014 09:23 AM   TRIG 78 07/13/2014 09:23 AM    HDL 64 07/13/2014 09:23 AM   CHOLHDL 2.7 08/02/2013 08:35 AM   LDLCALC 61 07/13/2014 09:23 AM    Wt Readings from Last 3 Encounters:  06/10/18 180 lb (81.6 kg)  03/31/17 188 lb 12.8 oz (85.6 kg)  03/25/16 186 lb 6 oz (84.5 kg)     Objective:    Vital Signs:  BP 123/64   Pulse 65   Ht 6\' 2"  (1.88 m)   Wt 180 lb (81.6 kg)   BMI 23.11 kg/m    Well nourished, well developed male in no acute distress.  He appeared physically fit. He appears significantly younger than his stated age of 84 HEENT is unremarkable. There is no JVD by exam. Respirations were normal. There were no audible wheezes. He denied any chest pain sensation to palpation. His abdomen by his inspection was unchanged and normal. There was no edema Neurologically he appeared intact He had normal cognition and affect   ASSESSMENT & PLAN:    1. PAF: Mr. Ludemann seems to be maintaining normal sinus rhythm.  He is unaware of any palpitations or arrhythmia.  His initial 2 episodes of atrial fibrillation occurred while on vacation initially in [place] when there was more EtOH involvement.  He had done well for 9 years without recurrent event at that time he was weaned off flecainide per his request.  He again developed recurrent AF episodes off flecainide necessitating reinstitution.  His treadmill test was normal and did not show any exercise-induced arrhythmia here QTc interval remains normal.  He continues to be on a low dose of chest 50 mg twice daily in addition to atenolol 12.5 mg daily.  2.  Anticoagulation: He is on Eliquis 5 mg twice a day.  There is no bleeding.  He is tolerating this well.  3.  Hyperlipidemia: He continues to be on rosuvastatin 10 mg.  He will have follow-up laboratory by Gabriel Schmidt.  4.  Diastolic dysfunction, grade 2: Noted on echo Doppler study in January 2017.  He remains active and denies any shortness of breath.  COVID-19 Education: The signs and symptoms of COVID-19 were discussed with  the patient and how to seek care for testing (follow up with PCP or arrange E-visit).  The importance of social distancing was discussed today.  Time:   Today, I have spent  25 minutes with the patient with telehealth technology discussing the above problems.     Medication Adjustments/Labs and Tests Ordered: Current medicines are reviewed at length with the patient today.  Concerns regarding medicines are outlined above.   Tests Ordered: No orders of the defined types were placed in this encounter.   Medication Changes: No orders of the defined types were placed in this encounter.   Disposition:  Follow up  One year  Signed, Gabriel Majestic, MD  06/10/2018 11:56 AM    [place]

## 2018-06-11 ENCOUNTER — Other Ambulatory Visit: Payer: Self-pay | Admitting: Cardiovascular Disease

## 2018-06-12 NOTE — Telephone Encounter (Signed)
Flecainide, Rosuvastatin and Atenolol refilled.

## 2018-11-04 ENCOUNTER — Other Ambulatory Visit: Payer: Self-pay

## 2018-11-04 NOTE — Telephone Encounter (Signed)
Refill

## 2018-11-05 ENCOUNTER — Other Ambulatory Visit: Payer: Self-pay

## 2018-11-05 MED ORDER — APIXABAN 5 MG PO TABS: ORAL_TABLET | ORAL | 2 refills | Status: DC

## 2018-11-05 NOTE — Telephone Encounter (Signed)
77m 81.6kg Scr 1.01 07/10/18 Lovw/kelly 06/10/18

## 2019-03-02 ENCOUNTER — Telehealth: Payer: Self-pay | Admitting: Cardiovascular Disease

## 2019-03-02 NOTE — Telephone Encounter (Signed)
New Message   Patient wants to know if he should take the covid 19 vaccine based upon his medical history. Please call.

## 2019-03-05 NOTE — Telephone Encounter (Signed)
Discussed with dr Claiborne Billings, left message for patient okay to take vaccine and he is clear from a cardiac standpoint to do so.

## 2019-05-10 ENCOUNTER — Other Ambulatory Visit: Payer: Self-pay | Admitting: Cardiovascular Disease

## 2019-08-04 ENCOUNTER — Other Ambulatory Visit: Payer: Self-pay | Admitting: Cardiovascular Disease

## 2019-08-05 NOTE — Telephone Encounter (Signed)
84 M 85.6 kg, SCr 1.01 (06/2018), LOV 4/21 Claiborne Billings

## 2019-08-10 ENCOUNTER — Other Ambulatory Visit: Payer: Self-pay | Admitting: Cardiovascular Disease

## 2020-02-01 ENCOUNTER — Other Ambulatory Visit: Payer: Self-pay | Admitting: Cardiovascular Disease

## 2020-02-01 NOTE — Telephone Encounter (Signed)
77m 81.6kg Creatinine, Serum 0.990 mg/ 09/09/2019 Lovw/kelly 06/10/19 Refill sent

## 2020-05-16 ENCOUNTER — Other Ambulatory Visit: Payer: Self-pay | Admitting: Cardiovascular Disease

## 2020-06-06 ENCOUNTER — Encounter: Payer: Self-pay | Admitting: Cardiovascular Disease

## 2020-06-06 ENCOUNTER — Ambulatory Visit: Payer: Medicare Other | Admitting: Cardiovascular Disease

## 2020-06-06 ENCOUNTER — Other Ambulatory Visit: Payer: Self-pay

## 2020-06-06 DIAGNOSIS — I5189 Other ill-defined heart diseases: Secondary | ICD-10-CM

## 2020-06-06 DIAGNOSIS — Z7901 Long term (current) use of anticoagulants: Secondary | ICD-10-CM

## 2020-06-06 DIAGNOSIS — I480 Paroxysmal atrial fibrillation: Secondary | ICD-10-CM

## 2020-06-06 DIAGNOSIS — E7800 Pure hypercholesterolemia, unspecified: Secondary | ICD-10-CM

## 2020-06-06 NOTE — Progress Notes (Signed)
Patient ID: Gabriel Schmidt, male   DOB: 1934/12/14, 86 y.o.   MRN: [id number]    Primary M.D.: Dr. Gus Height  HPI: Gabriel Schmidt is a 86 y.o. male who presents to the office today for a 2 year follow up cardiology evaluation.  Gabriel Schmidt has a history of PAF with 2 previously documented episodes in 2006 and in February 2008 prior to the past 6 months  He had been on combination therapy with Rythmol as well as low-dose beta blocker therapy.  He had been maintained on Coumadin and this was ultimately discontinued in September 2011.  He also has a history of hyperlipidemia for which she's been treated with Crestor 10 mg, mitral valve prolapse with mild MR, and remotely had noted some fatigability.  I last saw him in March 2014 at which time he was doing well on current therapy.  He apparently had seen Gabriel Schmidt in June 2015 and at that time expressed to her his wishes to discontinue Rythmol if possible.  She did not feel comfortable making that decision.  When I  saw him in February 2017, I recommended that he try to wean off Rythmol which he had done over a three-week period.  Since that time, he has been on atenolol 25 mg daily in addition to aspirin 81 mg, omega-3 fatty acids, and Crestor 10 mg.  He is unaware of any breakthrough rhythm disturbance.  He continues to remain stable.  In the past the episodes of paroxysmal atrial fibrillation occurred in the setting of more significant alcohol intake.  When I last saw him in May, he was feeling great and was  exercising regularly.  He works out 5 days per week.  He has remained in good physical shape.  He denies any weight gain.  He denies chest pain.  He denies shortness of breath.  He denies bleeding.  When I saw him, he had 2 recurrent episodes of atrial fibrillation.  The first was in 10/24/2014 when he developed atrial fibrillation while working out.  He presented to the emergency room  and was given eliquis and successfully cardioverted that day  back to sinus rhythm.  He went to the atrial fibrillation clinic in follow-up.  He had a cha2ds2vasc score of 2.  He was felt that he did not require daily medication at that point, particularly with a somewhat low blood pressure.  When he had developed atrial fibrillation with a rapid ventricular response he was hypotensive.  He had a recurrent episode on 03/02/2015 presented to the emergency room with recurrent onset of atrial fibrillation.  Eliquis was reinstituted since he had only taken it for a month after his August cardioversion and he again was successfully cardioverted back to sinus rhythm.  When I saw him in January 2017 because of his recurrent episodes, I recommended initiation of flecainide 50 mg twice a day.  He has tolerated this well and has not had any recurrent episodes.  He underwent an echo Doppler study in January 2017 which showed an EF of 60-65% with grade 2 diastolic dysfunction.  There was mild biatrial enlargement.  PA pressure was 34 mm.  There was trivial MR and TR.  He underwent a routine treadmill test on flexion.  I did make certain he did not have any exercise-induced arrhythmia.  This was normal.  He exercised for 9 minutes and did not develop chest pain or ECG changes.  I last saw him in the office in February 2019 and  since his prior evaluation in January 2018 he had remained stable.  He was unaware of recurrent atrial fibrillation and continues to be active.  He was going to the gym regularly.   He was last evaluated by me in April 2020 in a telemedicine visit.  He continued to be on flecainide 50 mg twice a day, atenolol 12.5 mg daily, Eliquis 5 mg twice a day, and rosuvastatin 10 mg.  He was unaware of any recurrent atrial fibrillation issues.  He continued to be followed by Dr. Mardene Sayer for his primary care who checks laboratory.  Since his last evaluation with me, he has felt well.  He is participating in Silver sneakers 3 days/week and also does additional  class 2 days/week of exercise.  He denies chest pain or shortness of breath.  There are no palpitations.  He had undergone laboratory in July 2021 which showed a total cholesterol 159, HDL 63, triglycerides 75, and LDL 82.  Renal function was stable with a creatinine of 0.99.  He denies bleeding on Eliquis.  He presents for 2-year evaluation.   Past Medical History:  Diagnosis Date  . Fatigue   . Hyperlipidemia   . Mitral valve prolapse   . Paroxysmal A-fib Gabriel Schmidt)     Past Surgical History:  Procedure Laterality Date  . CARDIAC CATHETERIZATION  06/19/2004  . CARDIOVERSION  04/14/2006  . TONSILLECTOMY AND ADENOIDECTOMY      Allergies  Allergen Reactions  . Clindamycin/Lincomycin   . Penicillins Rash    Has patient had a PCN reaction causing immediate rash, facial/tongue/throat swelling, SOB or lightheadedness with hypotension: No Has patient had a PCN reaction causing severe rash involving mucus membranes or skin necrosis: No Has patient had a PCN reaction that required hospitalization No Has patient had a PCN reaction occurring within the last 10 years: No If all of the above answers are "NO", then may proceed with Cephalosporin use.    Current Outpatient Medications  Medication Sig Dispense Refill  . atenolol (TENORMIN) 25 MG tablet TAKE ONE-HALF TABLET BY  MOUTH TWICE DAILY 90 tablet 0  . ELIQUIS 5 MG TABS tablet TAKE 1 TABLET(5 MG) BY MOUTH TWICE DAILY 180 tablet 1  . famotidine (PEPCID) 20 MG tablet Take 1 tablet by mouth at bedtime as needed.    . flecainide (TAMBOCOR) 50 MG tablet TAKE 1 TABLET BY MOUTH  TWICE DAILY 180 tablet 0  . rosuvastatin (CRESTOR) 10 MG tablet TAKE 1 TABLET BY MOUTH  DAILY 90 tablet 0   No current facility-administered medications for this visit.    Social History   Socioeconomic History  . Marital status: Married    Spouse name: Not on file  . Number of children: Not on file  . Years of education: Not on file  . Highest education level: Not  on file  Occupational History  . Not on file  Tobacco Use  . Smoking status: Never Smoker  . Smokeless tobacco: Never Used  Substance and Sexual Activity  . Alcohol use: Yes    Alcohol/week: 3.0 - 4.0 standard drinks    Types: 3 - 4 Standard drinks or equivalent per week  . Drug use: No  . Sexual activity: Not on file  Other Topics Concern  . Not on file  Social History Narrative  . Not on file   Social Determinants of Health   Financial Resource Strain: Not on file  Food Insecurity: Not on file  Transportation Needs: Not on file  Physical Activity: Not on file  Stress: Not on file  Social Connections: Not on file  Intimate Partner Violence: Not on file   Socially he is retired.  He has 3 children.  He previously was a [employer].  Family history is notable that both parents are deceased.  Father died of a stroke.  ROS General: Negative; No fevers, chills, or night sweats HEENT: Negative; No changes in vision or hearing, sinus congestion, difficulty swallowing Pulmonary: Negative; No cough, wheezing, shortness of breath, hemoptysis Cardiovascular: See HPI: No chest pain, presyncope, syncope, palpatations GI: Negative; No nausea, vomiting, diarrhea, or abdominal pain GU: Negative; No dysuria, hematuria, or difficulty voiding Musculoskeletal: Negative; no myalgias, joint pain, or weakness Hematologic: Negative; no easy bruising, bleeding Endocrine: Negative; no heat/cold intolerance; no diabetes, Neuro: Negative; no changes in balance, headaches Skin: Negative; No rashes or skin lesions Psychiatric: Negative; No behavioral problems, depression Sleep: Negative; No snoring,  daytime sleepiness, hypersomnolence, bruxism, restless legs, hypnogognic hallucinations. Other comprehensive 14 point system review is negative   Physical Exam BP 126/64 (BP Location: Left Arm, Patient Position: Sitting, Cuff Size: Normal)   Pulse 61   Ht '6\' 1"'  (1.854 m)   Wt 186 lb 9.6 oz  (84.6 kg)   SpO2 93%   BMI 24.62 kg/m    Repeat blood pressure by me was 130/68  Wt Readings from Last 3 Encounters:  06/06/20 186 lb 9.6 oz (84.6 kg)  06/10/18 180 lb (81.6 kg)  03/31/17 188 lb 12.8 oz (85.6 kg)   General: Alert, oriented, no distress.  Skin: normal turgor, no rashes, warm and dry HEENT: Normocephalic, atraumatic. Pupils equal round and reactive to light; sclera anicteric; extraocular muscles intact;  Nose without nasal septal hypertrophy Mouth/Parynx benign; Mallinpatti scale 2 Neck: No JVD, no carotid bruits; normal carotid upstroke Lungs: clear to ausculatation and percussion; no wheezing or rales Chest wall: without tenderness to palpitation Heart: PMI not displaced, RRR, s1 s2 normal, 1/6 systolic murmur, no diastolic murmur, no rubs, gallops, thrills, or heaves Abdomen: soft, nontender; no hepatosplenomehaly, BS+; abdominal aorta nontender and not dilated by palpation. Back: no CVA tenderness Pulses 2+ Musculoskeletal: full range of motion, normal strength, no joint deformities Extremities: no clubbing cyanosis or edema, Homan's sign negative  Neurologic: grossly nonfocal; Cranial nerves grossly wnl Psychologic: Normal mood and affect  ECG (independently read by me): NSR at 61; no ectopy, PR 200 msec; QTc interval normal  March 31, 2017 ECG (independently read by me): Sinus bradycardia 58 bpm.  One isolated PVC.  QTc interval 443 ms.  January 2018 ECG (independently read by me): Normal sinus rhythm at 64 bpm.  QTc interval 427 ms.  PR interval 194 ms.  No significant ST-T changes.  March 2017 ECG (independently read by me): Sinus bradycardia 58 bpm.  QTc interval 418 ms.  No significant ST segment changes.  January 2017 ECG (independently read by me): Sinus bradycardia at 51 bpm.  PR interval 180 ms; QTc interval 41 ms.  May 2016 ECG (independently read by me): Sinus bradycardia 59 bpm.  QTc interval 423 ms.  PR interval 188 ms  February 2016 ECG  (independently read by me): Sinus bradycardia 59 bpm.  Borderline first-degree AV block with a PR interval of 24 ms.  No significant ST segment changes.  LABS:  BMP Latest Ref Rng & Units 03/01/2015 10/24/2014 07/13/2014  Glucose 65 - 99 mg/dL 99 114(H) 92  BUN 6 - 20 mg/dL '13 16 14  ' Creatinine 0.61 -  1.24 mg/dL 1.18 1.21 0.98  BUN/Creat Ratio 10 - 22 - - 14  Sodium 135 - 145 mmol/L 142 140 142  Potassium 3.5 - 5.1 mmol/L 4.7 4.2 5.0  Chloride 101 - 111 mmol/L 105 106 103  CO2 22 - 32 mmol/L '30 28 24  ' Calcium 8.9 - 10.3 mg/dL 9.4 9.1 9.1     Hepatic Function Latest Ref Rng & Units 07/13/2014 08/02/2013  Total Protein 6.0 - 8.5 g/dL 5.9(L) 6.1  Albumin 3.5 - 4.8 g/dL 4.0 4.1  AST 0 - 40 IU/L 20 15  ALT 0 - 44 IU/L 11 10  Alk Phosphatase 39 - 117 IU/L 47 41  Total Bilirubin 0.0 - 1.2 mg/dL 1.0 1.4(H)     CBC Latest Ref Rng & Units 03/01/2015 10/24/2014 07/13/2014  WBC 4.0 - 10.5 K/uL 6.4 4.9 4.8  Hemoglobin 13.0 - 17.0 g/dL 15.1 15.2 15.0  Hematocrit 39.0 - 52.0 % 46.1 45.7 43.9  Platelets 150 - 400 K/uL 279 260 263   Lab Results  Component Value Date   MCV 89.5 03/01/2015   MCV 90.5 10/24/2014   MCV 90 07/13/2014   Lab Results  Component Value Date   TSH 1.700 07/13/2014    BNP No results found for: BNP  ProBNP No results found for: PROBNP   Lipid Panel     Component Value Date/Time   CHOL 141 07/13/2014 0923   TRIG 78 07/13/2014 0923   HDL 64 07/13/2014 0923   CHOLHDL 2.7 08/02/2013 0835   VLDL 17 08/02/2013 0835   LDLCALC 61 07/13/2014 0923     RADIOLOGY: No results found.  IMPRESSION:  1. PAF (paroxysmal atrial fibrillation) (Gabriel Schmidt)   2. Anticoagulation adequate   3. Grade II diastolic dysfunction   4. Pure hypercholesterolemia      ASSESSMENT AND PLAN: Gabriel Schmidt is a young appearing 86 year old white male who has a history of paroxysmal atrial fibrillation and had only experienced only 2 prior episodes of atrial fibrillation in over 10 year  period.  Remotely, his initial episode that was short lived while [place] in [place], [place].  He had no further recurrence for over 8 years and in 2016 he requested to be taken off his antiarrhythmic therapy and since he had been so stable Rythmol was discontinued.  He developed several recurrent episodes of atrial fibrillation on antiarrhythmic therapy since August 2016 and underwent cardioversion.  He is now on Eliquis for anticoagulation.  Over the last several years he has been without recurrent atrial fibrillation on his regimen consisting of flecainide 50 mg twice a day, atenolol 12.5 mg twice a day.  He is tolerating Eliquis without bleeding.  He is unaware of any palpitations.  He continues to be active and exercises at least 5 days/week.  He continues to be on rosuvastatin 10 mg.  Remotely, an echo Doppler study has shown normal LV function with grade 2 diastolic dysfunction and mitral annular calcification.  He denies any exertional dyspnea.  He will continue his current regimen.  He will see Dr. Harrington Challenger in follow-up laboratory will be obtained.  I will see him in 1 year for reevaluation or sooner as needed.  Troy Sine, MD, [facility]  06/07/2020 8:41 AM

## 2020-06-06 NOTE — Patient Instructions (Signed)
Medication Instructions:  Your physician recommends that you continue on your current medications as directed. Please refer to the Current Medication list given to you today.  *If you need a refill on your cardiac medications before your next appointment, please call your pharmacy*   Lab Work: None ordered  If you have labs (blood work) drawn today and your tests are completely normal, you will receive your results only by: Marland Kitchen MyChart Message (if you have MyChart) OR . A paper copy in the mail If you have any lab test that is abnormal or we need to change your treatment, we will call you to review the results.   Testing/Procedures: None ordered   Follow-Up: At [facility], you and your health needs are our priority.  As part of our continuing mission to provide you with exceptional heart care, we have created designated Provider Care Teams.  These Care Teams include your primary Cardiologist (physician) and Advanced Practice Providers (APPs -  Physician Assistants and Nurse Practitioners) who all work together to provide you with the care you need, when you need it.  We recommend signing up for the patient portal called "MyChart".  Sign up information is provided on this After Visit Summary.  MyChart is used to connect with patients for Virtual Visits (Telemedicine).  Patients are able to view lab/test results, encounter notes, upcoming appointments, etc.  Non-urgent messages can be sent to your provider as well.   To learn more about what you can do with MyChart, go to [email or web address].    Your next appointment:   12 month(s)  The format for your next appointment:   In Person  Provider:   Shelva Majestic, MD

## 2020-06-07 ENCOUNTER — Encounter: Payer: Self-pay | Admitting: Cardiovascular Disease

## 2020-07-13 DIAGNOSIS — L820 Inflamed seborrheic keratosis: Secondary | ICD-10-CM | POA: Diagnosis not present

## 2020-07-13 DIAGNOSIS — L853 Xerosis cutis: Secondary | ICD-10-CM | POA: Diagnosis not present

## 2020-07-13 DIAGNOSIS — Z85828 Personal history of other malignant neoplasm of skin: Secondary | ICD-10-CM | POA: Diagnosis not present

## 2020-07-13 DIAGNOSIS — L570 Actinic keratosis: Secondary | ICD-10-CM | POA: Diagnosis not present

## 2020-07-27 ENCOUNTER — Telehealth: Payer: Self-pay | Admitting: Cardiovascular Disease

## 2020-07-27 ENCOUNTER — Other Ambulatory Visit: Payer: Self-pay | Admitting: Cardiovascular Disease

## 2020-07-27 MED ORDER — ELIQUIS 5 MG PO TABS: ORAL_TABLET | ORAL | 1 refills | Status: DC

## 2020-07-27 NOTE — Telephone Encounter (Signed)
51m, 84.6kg, Creatinine, Serum 0.990 mg/ 09/09/2019, lovw/ kelly 06/06/20

## 2020-07-27 NOTE — Telephone Encounter (Signed)
Please review for refill, Thanks !  

## 2020-07-27 NOTE — Telephone Encounter (Signed)
*  STAT* If patient is at the pharmacy, call can be transferred to refill team.   1. Which medications need to be refilled? (please list name of each medication and dose if known) ELIQUIS 5 MG TABS tablet  2. Which pharmacy/location (including street and city if local pharmacy) is medication to be sent to? [place], [address] AT [place]  3. Do they need a 30 day or 90 day supply?  30 day supply  PT is out of this medication.HE states that he is leaving to go out of town and needs it refilled by Sunday

## 2020-08-14 DIAGNOSIS — R519 Headache, unspecified: Secondary | ICD-10-CM | POA: Diagnosis not present

## 2020-08-14 DIAGNOSIS — R059 Cough, unspecified: Secondary | ICD-10-CM | POA: Diagnosis not present

## 2020-08-14 DIAGNOSIS — R509 Fever, unspecified: Secondary | ICD-10-CM | POA: Diagnosis not present

## 2020-08-14 DIAGNOSIS — U071 COVID-19: Secondary | ICD-10-CM | POA: Diagnosis not present

## 2020-08-15 ENCOUNTER — Other Ambulatory Visit: Payer: Self-pay | Admitting: Cardiovascular Disease

## 2020-08-17 ENCOUNTER — Other Ambulatory Visit: Payer: Self-pay | Admitting: Cardiovascular Disease

## 2020-09-19 DIAGNOSIS — E785 Hyperlipidemia, unspecified: Secondary | ICD-10-CM | POA: Diagnosis not present

## 2020-09-19 DIAGNOSIS — R5383 Other fatigue: Secondary | ICD-10-CM | POA: Diagnosis not present

## 2020-09-19 DIAGNOSIS — D6869 Other thrombophilia: Secondary | ICD-10-CM | POA: Diagnosis not present

## 2020-09-19 DIAGNOSIS — R059 Cough, unspecified: Secondary | ICD-10-CM | POA: Diagnosis not present

## 2020-09-19 DIAGNOSIS — Z79899 Other long term (current) drug therapy: Secondary | ICD-10-CM | POA: Diagnosis not present

## 2020-09-19 DIAGNOSIS — Z0000 Encounter for general adult medical examination without abnormal findings: Secondary | ICD-10-CM | POA: Diagnosis not present

## 2020-12-02 ENCOUNTER — Other Ambulatory Visit: Payer: Self-pay | Admitting: Cardiovascular Disease

## 2020-12-05 DIAGNOSIS — D485 Neoplasm of uncertain behavior of skin: Secondary | ICD-10-CM | POA: Diagnosis not present

## 2020-12-05 DIAGNOSIS — L820 Inflamed seborrheic keratosis: Secondary | ICD-10-CM | POA: Diagnosis not present

## 2020-12-05 DIAGNOSIS — L570 Actinic keratosis: Secondary | ICD-10-CM | POA: Diagnosis not present

## 2020-12-20 DIAGNOSIS — M7989 Other specified soft tissue disorders: Secondary | ICD-10-CM | POA: Diagnosis not present

## 2020-12-20 DIAGNOSIS — S9032XA Contusion of left foot, initial encounter: Secondary | ICD-10-CM | POA: Diagnosis not present

## 2021-02-02 ENCOUNTER — Other Ambulatory Visit: Payer: Self-pay | Admitting: Cardiovascular Disease

## 2021-02-02 NOTE — Telephone Encounter (Signed)
Prescription refill request for Eliquis received. Indication:Afib Last office visit:4/22 Scr:0.9 Age: 86 Weight:84.6 kg  Prescription refilled

## 2021-05-15 ENCOUNTER — Other Ambulatory Visit: Payer: Self-pay | Admitting: Family Medicine

## 2021-05-15 ENCOUNTER — Ambulatory Visit
Admission: RE | Admit: 2021-05-15 | Discharge: 2021-05-15 | Disposition: A | Discharge status: Home / Self Care | Payer: Medicare Other | Source: Ambulatory Visit | Attending: Family Medicine | Admitting: Family Medicine

## 2021-05-15 DIAGNOSIS — M5459 Other low back pain: Secondary | ICD-10-CM

## 2021-05-15 DIAGNOSIS — M545 Low back pain, unspecified: Secondary | ICD-10-CM | POA: Diagnosis not present

## 2021-05-15 DIAGNOSIS — M549 Dorsalgia, unspecified: Secondary | ICD-10-CM | POA: Diagnosis not present

## 2021-06-25 ENCOUNTER — Other Ambulatory Visit: Payer: Self-pay

## 2021-06-25 ENCOUNTER — Telehealth: Payer: Self-pay | Admitting: Cardiovascular Disease

## 2021-06-25 MED ORDER — APIXABAN 5 MG PO TABS: ORAL_TABLET | ORAL | 1 refills | Status: DC

## 2021-06-25 NOTE — Telephone Encounter (Signed)
? ?*  STAT* If patient is at the pharmacy, call can be transferred to refill team. ? ? ?1. Which medications need to be refilled? (please list name of each medication and dose if known) ELIQUIS 5 MG TABS tablet ? ?2. Which pharmacy/location (including street and city if local pharmacy) is medication to be sent to?[facility] ([place], [address] [place] ? ?3. Do they need a 30 day or 90 day supply? 100 days supply ? ?[facility] said [facility] same price for 90 and 100 days supply  ?

## 2021-06-25 NOTE — Telephone Encounter (Signed)
Prescription refill request for Eliquis received. ?Indication:Afib ?Last office visit:upcoming ?Scr:0.9 ?Age: 87 ?Weight:84.6 kg ? ?Prescription refilled ? ?

## 2021-08-09 DIAGNOSIS — L570 Actinic keratosis: Secondary | ICD-10-CM | POA: Diagnosis not present

## 2021-08-09 DIAGNOSIS — Z85828 Personal history of other malignant neoplasm of skin: Secondary | ICD-10-CM | POA: Diagnosis not present

## 2021-08-09 DIAGNOSIS — I781 Nevus, non-neoplastic: Secondary | ICD-10-CM | POA: Diagnosis not present

## 2021-08-09 DIAGNOSIS — L853 Xerosis cutis: Secondary | ICD-10-CM | POA: Diagnosis not present

## 2021-08-09 DIAGNOSIS — L820 Inflamed seborrheic keratosis: Secondary | ICD-10-CM | POA: Diagnosis not present

## 2021-08-09 DIAGNOSIS — Z129 Encounter for screening for malignant neoplasm, site unspecified: Secondary | ICD-10-CM | POA: Diagnosis not present

## 2021-08-26 ENCOUNTER — Other Ambulatory Visit: Payer: Self-pay | Admitting: Cardiovascular Disease

## 2021-09-05 ENCOUNTER — Other Ambulatory Visit: Payer: Self-pay | Admitting: Cardiovascular Disease

## 2021-09-05 NOTE — Telephone Encounter (Signed)
Pt last saw Dr Claiborne Billings 06/06/20, pt is overdue for follow-up.  Pt has an appt scheduled on 12/05/21 to see Dr Claiborne Billings.  Last labs 09/19/20 Creat 0.99, age 87, weight 84.6kg, based on specified criteria pt is on appropriate dosage of [name]$  BID for afib.  Will refill rx to get pt to upcoming appt.

## 2021-09-20 DIAGNOSIS — L820 Inflamed seborrheic keratosis: Secondary | ICD-10-CM | POA: Diagnosis not present

## 2021-09-20 DIAGNOSIS — L570 Actinic keratosis: Secondary | ICD-10-CM | POA: Diagnosis not present

## 2021-09-27 DIAGNOSIS — Z79899 Other long term (current) drug therapy: Secondary | ICD-10-CM | POA: Diagnosis not present

## 2021-09-27 DIAGNOSIS — E785 Hyperlipidemia, unspecified: Secondary | ICD-10-CM | POA: Diagnosis not present

## 2021-10-01 DIAGNOSIS — B351 Tinea unguium: Secondary | ICD-10-CM | POA: Diagnosis not present

## 2021-10-01 DIAGNOSIS — E785 Hyperlipidemia, unspecified: Secondary | ICD-10-CM | POA: Diagnosis not present

## 2021-10-01 DIAGNOSIS — R0609 Other forms of dyspnea: Secondary | ICD-10-CM | POA: Diagnosis not present

## 2021-10-01 DIAGNOSIS — Z0000 Encounter for general adult medical examination without abnormal findings: Secondary | ICD-10-CM | POA: Diagnosis not present

## 2021-10-01 DIAGNOSIS — M545 Low back pain, unspecified: Secondary | ICD-10-CM | POA: Diagnosis not present

## 2021-10-01 DIAGNOSIS — K219 Gastro-esophageal reflux disease without esophagitis: Secondary | ICD-10-CM | POA: Diagnosis not present

## 2021-10-01 DIAGNOSIS — Z79899 Other long term (current) drug therapy: Secondary | ICD-10-CM | POA: Diagnosis not present

## 2021-10-10 ENCOUNTER — Telehealth: Payer: Self-pay

## 2021-10-10 NOTE — Telephone Encounter (Signed)
Labs received- highlights below:  Hgb- 14.0 BUN- 17 Creatinine- 1.03 GFR- 71 Sodium- 139 Potassium- 4.9  AST- 19 ALT- 11 Cholesterol- 140 Trigs- 58 HDL-61 LDL-67  Will scan into chart-

## 2021-10-10 NOTE — Telephone Encounter (Signed)
thx

## 2021-10-14 DIAGNOSIS — R21 Rash and other nonspecific skin eruption: Secondary | ICD-10-CM | POA: Diagnosis not present

## 2021-10-14 DIAGNOSIS — R079 Chest pain, unspecified: Secondary | ICD-10-CM | POA: Diagnosis not present

## 2021-10-14 DIAGNOSIS — R0789 Other chest pain: Secondary | ICD-10-CM | POA: Diagnosis not present

## 2021-10-14 DIAGNOSIS — B029 Zoster without complications: Secondary | ICD-10-CM | POA: Diagnosis not present

## 2021-10-14 DIAGNOSIS — I10 Essential (primary) hypertension: Secondary | ICD-10-CM | POA: Diagnosis not present

## 2021-10-19 DIAGNOSIS — D6869 Other thrombophilia: Secondary | ICD-10-CM | POA: Diagnosis not present

## 2021-10-19 DIAGNOSIS — I480 Paroxysmal atrial fibrillation: Secondary | ICD-10-CM | POA: Diagnosis not present

## 2021-10-22 ENCOUNTER — Other Ambulatory Visit: Payer: Self-pay | Admitting: Cardiovascular Disease

## 2021-10-22 DIAGNOSIS — I480 Paroxysmal atrial fibrillation: Secondary | ICD-10-CM

## 2021-10-22 NOTE — Telephone Encounter (Signed)
[name]$  refill request received. Patient is 87 years old, weight-84.6kg, Crea-1.03 on 09/27/2021 via scanned labs from [place], [place], and last seen by Dr. Claiborne Billings on 06/06/2020 and pending appt on 12/05/2021. Dose is appropriate based on dosing criteria. Will send in refill to requested pharmacy.

## 2021-10-25 ENCOUNTER — Ambulatory Visit: Payer: Medicare Other | Admitting: Pulmonary Disease

## 2021-10-25 ENCOUNTER — Encounter: Payer: Self-pay | Admitting: Pulmonary Disease

## 2021-10-25 VITALS — BP 117/52 | HR 72 | Temp 97.6°F | Ht 74.0 in | Wt 182.2 lb

## 2021-10-25 DIAGNOSIS — R0602 Shortness of breath: Secondary | ICD-10-CM | POA: Diagnosis not present

## 2021-10-25 NOTE — Progress Notes (Signed)
E wi  Synopsis: Referred in August 2023 for shortness of breath by Lona Kettle, MD  Subjective:   PATIENT ID: Gabriel Schmidt GENDER: male DOB: 1934-03-19, MRN: [id number]  HPI  Chief Complaint  Patient presents with   Consult    Referred d/t new SOB. Pulse ox low levels at home. Mornings wheezing and coughing.   Gabriel Schmidt is an 87 year old male, never smoker with paroxysmal atrial fibrillation, mitral valve prolapse and hyperlipidemia who is referred to pulmonary clinic for shortness of breath.   Cardiology note 05/2020 reviewed, at this time he reported participating in Silver sneakers 3 days/week and participating in additional class 2 days per week.  He had covid June 2022 and December 2021. He had 3 vaccines prior to infection. Since covid last year he reports noticing the increased dyspnea. He has congested cough in the mornings. He has mild seasonal allergies. Denies changes in his dyspnea with cold or hot weather. He remains active working in his yard with digging holes and cutting trees down.   He is a never smoker. He reports second hand smoke in childhood. He is a retired Land. Denies significant dust or chemical exposures. He lives with his wife. No autoimmune conditions in the family. His father died at age 60 with lung disease.  Past Medical History:  Diagnosis Date   Fatigue    Hyperlipidemia    Mitral valve prolapse    Paroxysmal A-fib (HCC)      Family History  Problem Relation Age of Onset   Stroke Father      Social History   Socioeconomic History   Marital status: Married    Spouse name: Not on file   Number of children: Not on file   Years of education: Not on file   Highest education level: Not on file  Occupational History   Not on file  Tobacco Use   Smoking status: Never   Smokeless tobacco: Never  Substance and Sexual Activity   Alcohol use: Yes    Alcohol/week: 3.0 - 4.0 standard drinks of alcohol    Types: 3 - 4 Standard  drinks or equivalent per week   Drug use: No   Sexual activity: Not on file  Other Topics Concern   Not on file  Social History Narrative   Not on file   Social Determinants of Health   Financial Resource Strain: Not on file  Food Insecurity: Not on file  Transportation Needs: Not on file  Physical Activity: Not on file  Stress: Not on file  Social Connections: Not on file  Intimate Partner Violence: Not on file     Allergies  Allergen Reactions   Clindamycin/Lincomycin    Penicillins Rash    Has patient had a PCN reaction causing immediate rash, facial/tongue/throat swelling, SOB or lightheadedness with hypotension: No Has patient had a PCN reaction causing severe rash involving mucus membranes or skin necrosis: No Has patient had a PCN reaction that required hospitalization No Has patient had a PCN reaction occurring within the last 10 years: No If all of the above answers are "NO", then may proceed with Cephalosporin use.     Outpatient Medications Prior to Visit  Medication Sig Dispense Refill   ammonium lactate (AMLACTIN) 12 % cream Apply 1 Application topically once.     apixaban (ELIQUIS) 5 MG TABS tablet Take 1 tablet (5 mg total) by mouth 2 (two) times daily. Please keep upcoming appt for refills. Thanks 180 tablet 0  atenolol (TENORMIN) 25 MG tablet TAKE ONE-HALF TABLET BY  MOUTH TWICE DAILY 90 tablet 3   famotidine (PEPCID) 20 MG tablet Take 1 tablet by mouth at bedtime as needed.     flecainide (TAMBOCOR) 50 MG tablet TAKE 1 TABLET BY MOUTH  TWICE DAILY 180 tablet 3   rosuvastatin (CRESTOR) 10 MG tablet TAKE 1 TABLET BY MOUTH  DAILY 90 tablet 3   No facility-administered medications prior to visit.    Review of Systems  Constitutional:  Negative for chills, fever, malaise/fatigue and weight loss.  HENT:  Negative for congestion, sinus pain and sore throat.   Eyes: Negative.   Respiratory:  Positive for cough and shortness of breath. Negative for hemoptysis,  sputum production and wheezing.   Cardiovascular:  Negative for chest pain, palpitations, orthopnea, claudication and leg swelling.  Gastrointestinal:  Positive for heartburn. Negative for abdominal pain, nausea and vomiting.  Genitourinary: Negative.   Musculoskeletal:  Positive for joint pain. Negative for myalgias.  Skin:  Negative for rash.  Neurological:  Negative for weakness.  Endo/Heme/Allergies: Negative.   Psychiatric/Behavioral: Negative.        Objective:   Vitals:   10/25/21 0936  BP: (!) 117/52  Pulse: 72  Temp: 97.6 F (36.4 C)  TempSrc: Oral  SpO2: 94%  Weight: 182 lb 3.2 oz (82.6 kg)  Height: '6\' 2"'$  (1.88 m)     Physical Exam Constitutional:      General: He is not in acute distress. HENT:     Head: Normocephalic and atraumatic.  Eyes:     Extraocular Movements: Extraocular movements intact.     Conjunctiva/sclera: Conjunctivae normal.     Pupils: Pupils are equal, round, and reactive to light.  Cardiovascular:     Rate and Rhythm: Normal rate and regular rhythm.     Pulses: Normal pulses.     Heart sounds: Normal heart sounds. No murmur heard. Pulmonary:     Breath sounds: Rales (very mild, medial left lower lung field) present.  Abdominal:     General: Bowel sounds are normal.     Palpations: Abdomen is soft.  Musculoskeletal:     Right lower leg: No edema.     Left lower leg: No edema.  Lymphadenopathy:     Cervical: No cervical adenopathy.  Skin:    General: Skin is warm and dry.  Neurological:     General: No focal deficit present.     Mental Status: He is alert.  Psychiatric:        Mood and Affect: Mood normal.        Behavior: Behavior normal.        Thought Content: Thought content normal.        Judgment: Judgment normal.       CBC    Component Value Date/Time   WBC 6.4 03/01/2015 1345   RBC 5.15 03/01/2015 1345   HGB 15.1 03/01/2015 1345   HGB 15.0 07/13/2014 0923   HCT 46.1 03/01/2015 1345   HCT 43.9 07/13/2014 0923    PLT 279 03/01/2015 1345   PLT 263 07/13/2014 0923   MCV 89.5 03/01/2015 1345   MCV 90 07/13/2014 0923   MCH 29.3 03/01/2015 1345   MCHC 32.8 03/01/2015 1345   RDW 12.5 03/01/2015 1345   RDW 13.1 07/13/2014 0923   LYMPHSABS 1.5 10/24/2014 1350   LYMPHSABS 1.5 07/13/2014 0923   MONOABS 0.4 10/24/2014 1350   EOSABS 0.1 10/24/2014 1350   EOSABS 0.2 07/13/2014 7408  BASOSABS 0.0 10/24/2014 1350   BASOSABS 0.0 07/13/2014 0923     Chest imaging: CXR 2017 Cardiac pads on the chest. Lungs are clear without airspace disease or pulmonary edema. Nodular density in the left lower chest probably represents a cardiac lead sticker. Heart size is normal. Trachea is midline. No large pleural effusions.  PFT:     No data to display          Labs:  Path:  Echo 2017: LV EF 60-65%. LA mildly dilated. RA mildly dilated. Grade II diastolic dysfunction. RV size and systolic function normal.  Heart Catheterization:  Assessment & Plan:   Shortness of breath  Discussion: Gabriel Schmidt is an 87 year old male, never smoker with paroxysmal atrial fibrillation, mitral valve prolapse and hyperlipidemia who is referred to pulmonary clinic for shortness of breath.   His shortness of breath is possibly related to deconditioning due to lack of activity throughout the pandemic. He has started to be more active over recent months.   He did not have oxygen desaturation on simple walk in clinic today.   He is reassured by our findings today and we discussed obtaining pulmonary function tests and high resolution CT chest to rule out ILD or post-covid inflammation. He would like to hold off on testing at this time unless his symptoms were to progress.  Follow up in 1 year.   Freda Jackson, MD [facility] Office: [phone]   Current Outpatient Medications:    ammonium lactate (AMLACTIN) 12 % cream, Apply 1 Application topically once., Disp: , Rfl:    apixaban  (ELIQUIS) 5 MG TABS tablet, Take 1 tablet (5 mg total) by mouth 2 (two) times daily. Please keep upcoming appt for refills. Thanks, Disp: 180 tablet, Rfl: 0   atenolol (TENORMIN) 25 MG tablet, TAKE ONE-HALF TABLET BY  MOUTH TWICE DAILY, Disp: 90 tablet, Rfl: 3   famotidine (PEPCID) 20 MG tablet, Take 1 tablet by mouth at bedtime as needed., Disp: , Rfl:    flecainide (TAMBOCOR) 50 MG tablet, TAKE 1 TABLET BY MOUTH  TWICE DAILY, Disp: 180 tablet, Rfl: 3   rosuvastatin (CRESTOR) 10 MG tablet, TAKE 1 TABLET BY MOUTH  DAILY, Disp: 90 tablet, Rfl: 3

## 2021-10-25 NOTE — Patient Instructions (Addendum)
We will check your oxygen levels on simple walk today.   If your levels do not drop below 88%, then everything is safe  We will consider high resolution CT Chest and pulmonary function testing in the future if your symptoms worsen  Follow up in 1 year

## 2021-12-05 ENCOUNTER — Encounter: Payer: Self-pay | Admitting: Cardiovascular Disease

## 2021-12-05 ENCOUNTER — Ambulatory Visit: Payer: Medicare Other | Attending: Cardiovascular Disease | Admitting: Cardiovascular Disease

## 2021-12-05 VITALS — BP 110/56 | HR 63 | Ht 73.0 in | Wt 184.0 lb

## 2021-12-05 DIAGNOSIS — I5189 Other ill-defined heart diseases: Secondary | ICD-10-CM | POA: Diagnosis not present

## 2021-12-05 DIAGNOSIS — I480 Paroxysmal atrial fibrillation: Secondary | ICD-10-CM | POA: Diagnosis not present

## 2021-12-05 DIAGNOSIS — Z7901 Long term (current) use of anticoagulants: Secondary | ICD-10-CM

## 2021-12-05 DIAGNOSIS — I8392 Asymptomatic varicose veins of left lower extremity: Secondary | ICD-10-CM

## 2021-12-05 DIAGNOSIS — I341 Nonrheumatic mitral (valve) prolapse: Secondary | ICD-10-CM

## 2021-12-05 DIAGNOSIS — E7800 Pure hypercholesterolemia, unspecified: Secondary | ICD-10-CM

## 2021-12-05 NOTE — Patient Instructions (Signed)
Medication Instructions:  Continue same medications *If you need a refill on your cardiac medications before your next appointment, please call your pharmacy*   Lab Work: Non ordered   Testing/Procedures: None ordered   Follow-Up: At [facility], you and your health needs are our priority.  As part of our continuing mission to provide you with exceptional heart care, we have created designated Provider Care Teams.  These Care Teams include your primary Cardiologist (physician) and Advanced Practice Providers (APPs -  Physician Assistants and Nurse Practitioners) who all work together to provide you with the care you need, when you need it.  We recommend signing up for the patient portal called "MyChart".  Sign up information is provided on this After Visit Summary.  MyChart is used to connect with patients for Virtual Visits (Telemedicine).  Patients are able to view lab/test results, encounter notes, upcoming appointments, etc.  Non-urgent messages can be sent to your provider as well.   To learn more about what you can do with MyChart, go to [email or web address].    Your next appointment:  1 year   Call in July to schedule Oct appointment     The format for your next appointment: Office   Provider:  [facility]   Important Information About Sugar

## 2021-12-05 NOTE — Progress Notes (Signed)
Patient ID: Tin Engram Townshend, male   DOB: 02-07-1935, 87 y.o.   MRN: [id number]       Primary M.D.: Dr. Gus Height  HPI: Royal Vandevoort Heishman is a 87 y.o. male who presents to the office today for an 1 month follow up cardiology evaluation.  Mr. Godley has a history of PAF with 2 previously documented episodes in 2006 and in February 2008 prior to the past 6 months  He had been on combination therapy with Rythmol as well as low-dose beta blocker therapy.  He had been maintained on Coumadin and this was ultimately discontinued in September 2011.  He also has a history of hyperlipidemia for which she's been treated with Crestor 10 mg, mitral valve prolapse with mild MR, and remotely had noted some fatigability.  I last saw him in March 2014 at which time he was doing well on current therapy.  He apparently had seen Lesia Hausen in June 2015 and at that time expressed to her his wishes to discontinue Rythmol if possible.  She did not feel comfortable making that decision.  When I  saw him in February 2017, I recommended that he try to wean off Rythmol which he had done over a three-week period.  Since that time, he has been on atenolol 25 mg daily in addition to aspirin 81 mg, omega-3 fatty acids, and Crestor 10 mg.  He is unaware of any breakthrough rhythm disturbance.  He continues to remain stable.  In the past the episodes of paroxysmal atrial fibrillation occurred in the setting of more significant alcohol intake.  When I last saw him in May, he was feeling great and was  exercising regularly.  He works out 5 days per week.  He has remained in good physical shape.  He denies any weight gain.  He denies chest pain.  He denies shortness of breath.  He denies bleeding.  When I saw him, he had 2 recurrent episodes of atrial fibrillation.  The first was in 10/24/2014 when he developed atrial fibrillation while working out.  He presented to the emergency room  and was given eliquis and successfully cardioverted  that day back to sinus rhythm.  He went to the atrial fibrillation clinic in follow-up.  He had a cha2ds2vasc score of 2.  He was felt that he did not require daily medication at that point, particularly with a somewhat low blood pressure.  When he had developed atrial fibrillation with a rapid ventricular response he was hypotensive.  He had a recurrent episode on 03/02/2015 presented to the emergency room with recurrent onset of atrial fibrillation.  Eliquis was reinstituted since he had only taken it for a month after his August cardioversion and he again was successfully cardioverted back to sinus rhythm.  When I saw him in January 2017 because of his recurrent episodes, I recommended initiation of flecainide 50 mg twice a day.  He has tolerated this well and has not had any recurrent episodes.  He underwent an echo Doppler study in January 2017 which showed an EF of 60-65% with grade 2 diastolic dysfunction.  There was mild biatrial enlargement.  PA pressure was 34 mm.  There was trivial MR and TR.  He underwent a routine treadmill test on flexion.  I did make certain he did not have any exercise-induced arrhythmia.  This was normal.  He exercised for 9 minutes and did not develop chest pain or ECG changes.  I last saw him in the office in  February 2019 and since his prior evaluation in January 2018 he had remained stable.  He was unaware of recurrent atrial fibrillation and continues to be active.  He was going to the gym regularly.   He was evaluated by me in April 2020 in a telemedicine visit.  He continued to be on flecainide 50 mg twice a day, atenolol 12.5 mg daily, Eliquis 5 mg twice a day, and rosuvastatin 10 mg.  He was unaware of any recurrent atrial fibrillation issues.  He continued to be followed by Dr. Mardene Sayer for his primary care who checks laboratory.  I last saw him on 10/06/2020.  Since his prior evaluation he continues to feel well and was participating in Silver sneakers 3  days/week and also does additional class 2 days/week of exercise.  He denies chest pain or shortness of breath.  There are no palpitations.  He had undergone laboratory in July 2021 which showed a total cholesterol 159, HDL 63, triglycerides 75, and LDL 82.  Renal function was stable with a creatinine of 0.99.  He denies bleeding on Eliquis.  He was unaware of any recurrent palpitations.  Since I last saw him, he continues to do well.  He stays active and goes to the gym regularly.  He denies any chest pain palpitations, or change in exercise tolerance.  He continues to be on Eliquis 5 mg twice a day for anticoagulation, atenolol 12.5 mg twice a day in addition to flecainide 50 mg twice a day.  He is on rosuvastatin 10 mg.  He presents for evaluation.   Past Medical History:  Diagnosis Date   Fatigue    Hyperlipidemia    Mitral valve prolapse    Paroxysmal A-fib [facility])     Past Surgical History:  Procedure Laterality Date   CARDIAC CATHETERIZATION  06/19/2004   CARDIOVERSION  04/14/2006   TONSILLECTOMY AND ADENOIDECTOMY      Allergies  Allergen Reactions   Clindamycin/Lincomycin    Penicillins Rash    Has patient had a PCN reaction causing immediate rash, facial/tongue/throat swelling, SOB or lightheadedness with hypotension: No Has patient had a PCN reaction causing severe rash involving mucus membranes or skin necrosis: No Has patient had a PCN reaction that required hospitalization No Has patient had a PCN reaction occurring within the last 10 years: No If all of the above answers are "NO", then may proceed with Cephalosporin use.    Current Outpatient Medications  Medication Sig Dispense Refill   ammonium lactate (AMLACTIN) 12 % cream Apply 1 Application topically once.     apixaban (ELIQUIS) 5 MG TABS tablet Take 1 tablet (5 mg total) by mouth 2 (two) times daily. Please keep upcoming appt for refills. Thanks 180 tablet 0   atenolol (TENORMIN) 25 MG tablet TAKE ONE-HALF TABLET  BY  MOUTH TWICE DAILY 90 tablet 3   famotidine (PEPCID) 20 MG tablet Take 1 tablet by mouth at bedtime as needed.     flecainide (TAMBOCOR) 50 MG tablet TAKE 1 TABLET BY MOUTH  TWICE DAILY 180 tablet 3   rosuvastatin (CRESTOR) 10 MG tablet TAKE 1 TABLET BY MOUTH  DAILY 90 tablet 3   No current facility-administered medications for this visit.    Social History   Socioeconomic History   Marital status: Married    Spouse name: Not on file   Number of children: Not on file   Years of education: Not on file   Highest education level: Not on file  Occupational  History   Not on file  Tobacco Use   Smoking status: Never   Smokeless tobacco: Never  Substance and Sexual Activity   Alcohol use: Yes    Alcohol/week: 3.0 - 4.0 standard drinks of alcohol    Types: 3 - 4 Standard drinks or equivalent per week   Drug use: No   Sexual activity: Not on file  Other Topics Concern   Not on file  Social History Narrative   Not on file   Social Determinants of Health   Financial Resource Strain: Not on file  Food Insecurity: Not on file  Transportation Needs: Not on file  Physical Activity: Not on file  Stress: Not on file  Social Connections: Not on file  Intimate Partner Violence: Not on file   Socially he is retired.  He has 3 children.  He previously was a Land.  Family history is notable that both parents are deceased.  Father died of a stroke.  ROS General: Negative; No fevers, chills, or night sweats HEENT: Negative; No changes in vision or hearing, sinus congestion, difficulty swallowing Pulmonary: Negative; No cough, wheezing, shortness of breath, hemoptysis Cardiovascular: See HPI: No chest pain, presyncope, syncope, palpatations GI: Negative; No nausea, vomiting, diarrhea, or abdominal pain GU: Negative; No dysuria, hematuria, or difficulty voiding Musculoskeletal: Negative; no myalgias, joint pain, or weakness Hematologic: Negative; no easy bruising,  bleeding Endocrine: Negative; no heat/cold intolerance; no diabetes, Neuro: Negative; no changes in balance, headaches Skin: Negative; No rashes or skin lesions Psychiatric: Negative; No behavioral problems, depression Sleep: Negative; No snoring,  daytime sleepiness, hypersomnolence, bruxism, restless legs, hypnogognic hallucinations. Other comprehensive 14 point system review is negative   Physical Exam BP (!) 110/56   Pulse 63   Ht _0  (1.854 m)   Wt 184 lb (83.5 kg)   SpO2 95%   BMI 24.28 kg/m    Repeat blood pressure by me was 128/60  Wt Readings from Last 3 Encounters:  12/05/21 184 lb (83.5 kg)  10/25/21 182 lb 3.2 oz (82.6 kg)  06/06/20 186 lb 9.6 oz (84.6 kg)   General: Alert, oriented, no distress.  Appears younger than stated age. Skin: normal turgor, no rashes, warm and dry HEENT: Normocephalic, atraumatic. Pupils equal round and reactive to light; sclera anicteric; extraocular muscles intact;  Nose without nasal septal hypertrophy Mouth/Parynx benign; Mallinpatti scale 2 Neck: No JVD, no carotid bruits; normal carotid upstroke Lungs: clear to ausculatation and percussion; no wheezing or rales Chest wall: without tenderness to palpitation Heart: PMI not displaced, RRR, s1 s2 normal, 1/6 systolic murmur, no diastolic murmur, no rubs, gallops, thrills, or heaves Abdomen: soft, nontender; no hepatosplenomehaly, BS+; abdominal aorta nontender and not dilated by palpation. Back: no CVA tenderness Pulses 2+ Musculoskeletal: full range of motion, normal strength, no joint deformities Extremities: Left calf varicose veins; no clubbing cyanosis or edema, Homan's sign negative  Neurologic: grossly nonfocal; Cranial nerves grossly wnl Psychologic: Normal mood and affect    December 05, 2021 ECG (independently read by me):  NSR at 63, PR 206 msec, QTc 425 msec  June 06, 2020 ECG (independently read by me): NSR at 61; no ectopy, PR 200 msec; QTc interval 426  msec  March 31, 2017 ECG (independently read by me): Sinus bradycardia 58 bpm.  One isolated PVC.  QTc interval 443 ms.  January 2018 ECG (independently read by me): Normal sinus rhythm at 64 bpm.  QTc interval 427 ms.  PR interval 194 ms.  No significant  ST-T changes.  March 2017 ECG (independently read by me): Sinus bradycardia 58 bpm.  QTc interval 418 ms.  No significant ST segment changes.  January 2017 ECG (independently read by me): Sinus bradycardia at 51 bpm.  PR interval 180 ms; QTc interval 41 ms.  May 2016 ECG (independently read by me): Sinus bradycardia 59 bpm.  QTc interval 423 ms.  PR interval 188 ms  February 2016 ECG (independently read by me): Sinus bradycardia 59 bpm.  Borderline first-degree AV block with a PR interval of 24 ms.  No significant ST segment changes.  LABS:     Latest Ref Rng & Units 03/01/2015    1:45 PM 10/24/2014    1:50 PM 07/13/2014    9:23 AM  BMP  Glucose 65 - 99 mg/dL 99  114  92   BUN 6 - 20 mg/dL _0 Creatinine 0.61 - 1.24 mg/dL 1.18  1.21  0.98   BUN/Creat Ratio 10 - 22   14   Sodium 135 - 145 mmol/L 142  140  142   Potassium 3.5 - 5.1 mmol/L 4.7  4.2  5.0   Chloride 101 - 111 mmol/L 105  106  103   CO2 22 - 32 mmol/L _1 Calcium 8.9 - 10.3 mg/dL 9.4  9.1  9.1         Latest Ref Rng & Units 07/13/2014    9:23 AM 08/02/2013    8:35 AM  Hepatic Function  Total Protein 6.0 - 8.5 g/dL 5.9  6.1   Albumin 3.5 - 4.8 g/dL 4.0  4.1   AST 0 - 40 IU/L 20  15   ALT 0 - 44 IU/L 11  10   Alk Phosphatase 39 - 117 IU/L 47  41   Total Bilirubin 0.0 - 1.2 mg/dL 1.0  1.4         Latest Ref Rng & Units 03/01/2015    1:45 PM 10/24/2014    1:50 PM 07/13/2014    9:23 AM  CBC  WBC 4.0 - 10.5 K/uL 6.4  4.9  4.8   Hemoglobin 13.0 - 17.0 g/dL 15.1  15.2  15.0   Hematocrit 39.0 - 52.0 % 46.1  45.7  43.9   Platelets 150 - 400 K/uL 279  260  263    Lab Results  Component Value Date   MCV 89.5 03/01/2015   MCV 90.5 10/24/2014    MCV 90 07/13/2014   Lab Results  Component Value Date   TSH 1.700 07/13/2014    BNP No results found for: "BNP"  ProBNP No results found for: "PROBNP"   Lipid Panel     Component Value Date/Time   CHOL 141 07/13/2014 0923   TRIG 78 07/13/2014 0923   HDL 64 07/13/2014 0923   CHOLHDL 2.7 08/02/2013 0835   VLDL 17 08/02/2013 0835   LDLCALC 61 07/13/2014 0923     RADIOLOGY: No results found.  IMPRESSION:  1. PAF (paroxysmal atrial fibrillation) ([place])   2. MVP (mitral valve prolapse)   3. Anticoagulation adequate   4. Grade II diastolic dysfunction   5. Pure hypercholesterolemia   6. Asymptomatic varicose veins of left lower extremity     ASSESSMENT AND PLAN: Mr. Kees Toops is a young appearing 87 year old white male who has a history of paroxysmal atrial fibrillation and had only experienced only 2 prior episodes of atrial fibrillation in over 10 year period.  Remotely,  his initial episode that was short lived while [place] in [place], [place].  He had no further recurrence for over 8 years and in 2016 he requested to be taken off his antiarrhythmic therapy and since he had been so stable Rythmol was discontinued.  He developed several recurrent episodes of atrial fibrillation on antiarrhythmic therapy since August 2016 and underwent cardioversion.  He is now on Eliquis for anticoagulation.  Over the last several years he has been without recurrent atrial fibrillation and presently continues to be on his regimen of flecainide 50 mg twice a day, atenolol 12.5 mg twice a day.  ECG is stable.  QTc interval is normal.  He has left calf varicose veins and at times notes trivial left ankle edema.  His echo Doppler study in 2017 showed normal systolic function with an EF of 60 to 65% without wall motion abnormality.  At that time he had grade 2 diastolic dysfunction and there was evidence for mild atrial enlargement.  He continues to be followed by Dr. Regan Rakers laboratory.  He had  recently been evaluated by pulmonary and was stable.  In August he had presented to the emergency room with shingles with blood pressure elevation which stabilized and he was treated with Valtrex for 10 days.  Clinically he is stable on his current regimen.  There is no bleeding with Eliquis.  He continues to be on low-dose rosuvastatin 10 mg.  Dr. Lona Kettle checks his laboratory.  I will see him in 1 year for reevaluation or sooner as needed.    Troy Sine, MD, [facility]  12/05/2021 6:31 PM

## 2022-02-16 ENCOUNTER — Other Ambulatory Visit: Payer: Self-pay | Admitting: Cardiovascular Disease

## 2022-02-16 DIAGNOSIS — I480 Paroxysmal atrial fibrillation: Secondary | ICD-10-CM

## 2022-05-08 DIAGNOSIS — R21 Rash and other nonspecific skin eruption: Secondary | ICD-10-CM | POA: Diagnosis not present

## 2022-05-08 DIAGNOSIS — L259 Unspecified contact dermatitis, unspecified cause: Secondary | ICD-10-CM | POA: Diagnosis not present

## 2022-05-12 ENCOUNTER — Other Ambulatory Visit: Payer: Self-pay | Admitting: Cardiovascular Disease

## 2022-05-13 DIAGNOSIS — I480 Paroxysmal atrial fibrillation: Secondary | ICD-10-CM | POA: Diagnosis not present

## 2022-05-13 DIAGNOSIS — R21 Rash and other nonspecific skin eruption: Secondary | ICD-10-CM | POA: Diagnosis not present

## 2022-05-13 DIAGNOSIS — Z6824 Body mass index (BMI) 24.0-24.9, adult: Secondary | ICD-10-CM | POA: Diagnosis not present

## 2022-05-13 DIAGNOSIS — D6869 Other thrombophilia: Secondary | ICD-10-CM | POA: Diagnosis not present

## 2022-05-14 ENCOUNTER — Telehealth: Payer: Self-pay | Admitting: Cardiovascular Disease

## 2022-05-14 MED ORDER — ATENOLOL 25 MG PO TABS: 12.5000 mg | ORAL_TABLET | Freq: Two times a day (BID) | ORAL | 1 refills | Status: DC

## 2022-05-14 NOTE — Telephone Encounter (Signed)
*  STAT* If patient is at the pharmacy, call can be transferred to refill team.   1. Which medications need to be refilled? (please list name of each medication and dose if known)   atenolol (TENORMIN) 25 MG tablet    2. Which pharmacy/location (including street and city if local pharmacy) is medication to be sent to?    [id number] - [place] - [address] AT [place] [phone] [address] [place] [phone]    3. Do they need a 30 day or 90 day supply? 35    Pt mail order will not be here quick enough, please send to local pharmacy

## 2022-06-07 ENCOUNTER — Other Ambulatory Visit: Payer: Self-pay | Admitting: Cardiovascular Disease

## 2022-06-07 DIAGNOSIS — I480 Paroxysmal atrial fibrillation: Secondary | ICD-10-CM

## 2022-06-07 NOTE — Telephone Encounter (Signed)
Prescription refill request for Eliquis received. Indication: Afib  Last office visit: 12/05/21 Tresa Endo)  Scr: 0.88 (10/14/21)  Age: 88 Weight: 83.5kg  Appropriate dose. Refill sent.

## 2022-07-25 ENCOUNTER — Telehealth: Payer: Self-pay | Admitting: Cardiovascular Disease

## 2022-07-25 NOTE — Telephone Encounter (Signed)
Attempted to call patient, left message for patient to call back to office.   

## 2022-07-25 NOTE — Telephone Encounter (Signed)
Patient is calling stating he is going to [place] at the end of June.  He is wanting to know if there are any meds or precautions he needs to take due to his condition.   Please advise.

## 2022-07-26 NOTE — Telephone Encounter (Signed)
Have him reach out to the [facility] - they can tell him what he needs based on where in [place] he will be going      It's in the [facility]  -  [phone]   Called and gave the patient the information above. He asked me to call back and leave this info on his voicemail because he is driving and cannot write it down. Called and left a voicemail with the info above.

## 2022-07-26 NOTE — Telephone Encounter (Signed)
Attempted to call patient, left message for patient to call back to office.   

## 2022-07-26 NOTE — Telephone Encounter (Signed)
Have him reach out to the [facility] - they can tell him what he needs based on where in [place] he will be going     It's in the [facility]  -  [phone]

## 2022-08-01 DIAGNOSIS — R609 Edema, unspecified: Secondary | ICD-10-CM | POA: Diagnosis not present

## 2022-08-01 DIAGNOSIS — I868 Varicose veins of other specified sites: Secondary | ICD-10-CM | POA: Diagnosis not present

## 2022-08-01 DIAGNOSIS — M79671 Pain in right foot: Secondary | ICD-10-CM | POA: Diagnosis not present

## 2022-08-01 DIAGNOSIS — Z7189 Other specified counseling: Secondary | ICD-10-CM | POA: Diagnosis not present

## 2022-10-15 DIAGNOSIS — Z79899 Other long term (current) drug therapy: Secondary | ICD-10-CM | POA: Diagnosis not present

## 2022-10-15 DIAGNOSIS — E785 Hyperlipidemia, unspecified: Secondary | ICD-10-CM | POA: Diagnosis not present

## 2022-10-15 LAB — COMPREHENSIVE METABOLIC PANEL: EGFR: 73

## 2022-10-17 DIAGNOSIS — D485 Neoplasm of uncertain behavior of skin: Secondary | ICD-10-CM | POA: Diagnosis not present

## 2022-10-17 DIAGNOSIS — B351 Tinea unguium: Secondary | ICD-10-CM | POA: Diagnosis not present

## 2022-10-17 DIAGNOSIS — Z85828 Personal history of other malignant neoplasm of skin: Secondary | ICD-10-CM | POA: Diagnosis not present

## 2022-10-17 DIAGNOSIS — L820 Inflamed seborrheic keratosis: Secondary | ICD-10-CM | POA: Diagnosis not present

## 2022-10-17 DIAGNOSIS — D0471 Carcinoma in situ of skin of right lower limb, including hip: Secondary | ICD-10-CM | POA: Diagnosis not present

## 2022-10-17 DIAGNOSIS — L603 Nail dystrophy: Secondary | ICD-10-CM | POA: Diagnosis not present

## 2022-10-17 DIAGNOSIS — Z129 Encounter for screening for malignant neoplasm, site unspecified: Secondary | ICD-10-CM | POA: Diagnosis not present

## 2022-10-17 DIAGNOSIS — L570 Actinic keratosis: Secondary | ICD-10-CM | POA: Diagnosis not present

## 2022-10-22 DIAGNOSIS — Z0000 Encounter for general adult medical examination without abnormal findings: Secondary | ICD-10-CM | POA: Diagnosis not present

## 2022-10-22 DIAGNOSIS — I4891 Unspecified atrial fibrillation: Secondary | ICD-10-CM | POA: Diagnosis not present

## 2022-10-29 DIAGNOSIS — D485 Neoplasm of uncertain behavior of skin: Secondary | ICD-10-CM | POA: Diagnosis not present

## 2022-10-29 DIAGNOSIS — L738 Other specified follicular disorders: Secondary | ICD-10-CM | POA: Diagnosis not present

## 2022-10-29 DIAGNOSIS — B351 Tinea unguium: Secondary | ICD-10-CM | POA: Diagnosis not present

## 2022-10-29 DIAGNOSIS — L281 Prurigo nodularis: Secondary | ICD-10-CM | POA: Diagnosis not present

## 2022-10-29 DIAGNOSIS — D0471 Carcinoma in situ of skin of right lower limb, including hip: Secondary | ICD-10-CM | POA: Diagnosis not present

## 2023-01-14 ENCOUNTER — Encounter: Payer: Self-pay | Admitting: Cardiovascular Disease

## 2023-01-14 ENCOUNTER — Ambulatory Visit: Payer: Medicare Other | Attending: Cardiovascular Disease | Admitting: Cardiovascular Disease

## 2023-01-14 VITALS — BP 126/84 | HR 56 | Ht 74.0 in | Wt 190.0 lb

## 2023-01-14 DIAGNOSIS — I8392 Asymptomatic varicose veins of left lower extremity: Secondary | ICD-10-CM

## 2023-01-14 DIAGNOSIS — I341 Nonrheumatic mitral (valve) prolapse: Secondary | ICD-10-CM | POA: Diagnosis not present

## 2023-01-14 DIAGNOSIS — I480 Paroxysmal atrial fibrillation: Secondary | ICD-10-CM | POA: Diagnosis not present

## 2023-01-14 DIAGNOSIS — Z7901 Long term (current) use of anticoagulants: Secondary | ICD-10-CM | POA: Diagnosis not present

## 2023-01-14 DIAGNOSIS — E785 Hyperlipidemia, unspecified: Secondary | ICD-10-CM

## 2023-01-14 DIAGNOSIS — E7800 Pure hypercholesterolemia, unspecified: Secondary | ICD-10-CM | POA: Diagnosis not present

## 2023-01-14 MED ORDER — APIXABAN 5 MG PO TABS: 5.0000 mg | ORAL_TABLET | Freq: Two times a day (BID) | ORAL | Status: DC

## 2023-01-14 NOTE — Patient Instructions (Signed)
Medication Instructions:  Samples given for Eliquis 5mg . Take 1 tablet twice daily *If you need a refill on your cardiac medications before your next appointment, please call your pharmacy*   Lab Work: None If you have labs (blood work) drawn today and your tests are completely normal, you will receive your results only by: MyChart Message (if you have MyChart) OR A paper copy in the mail If you have any lab test that is abnormal or we need to change your treatment, we will call you to review the results.   Testing/Procedures: None   Follow-Up: At [facility], you and your health needs are our priority.  As part of our continuing mission to provide you with exceptional heart care, we have created designated Provider Care Teams.  These Care Teams include your primary Cardiologist (physician) and Advanced Practice Providers (APPs -  Physician Assistants and Nurse Practitioners) who all work together to provide you with the care you need, when you need it.  We recommend signing up for the patient portal called "MyChart".  Sign up information is provided on this After Visit Summary.  MyChart is used to connect with patients for Virtual Visits (Telemedicine).  Patients are able to view lab/test results, encounter notes, upcoming appointments, etc.  Non-urgent messages can be sent to your provider as well.   To learn more about what you can do with MyChart, go to [email or web address].    Your next appointment:   1 year(s)  Provider:   Dr. Rachelle Hora Croitoru

## 2023-01-14 NOTE — Progress Notes (Signed)
Patient ID: Gabriel Schmidt, male   DOB: May 15, 1934, 88 y.o.   MRN: [id number]       Primary M.D.: Dr. Miguel Schmidt  HPI: Gabriel Schmidt is a 88 y.o. male who presents to the office today for an 13 month follow up cardiology evaluation.  Gabriel Schmidt has a history of PAF with 2 previously documented episodes in 2006 and in February 2008 prior to the past 6 months  He had been on combination therapy with Rythmol as well as low-dose beta blocker therapy.  He had been maintained on Coumadin and this was ultimately discontinued in September 2011.  He also has a history of hyperlipidemia for which she's been treated with Crestor 10 mg, mitral valve prolapse with mild MR, and remotely had noted some fatigability.  I last saw him in March 2014 at which time he was doing well on current therapy.  He apparently had seen Gabriel Schmidt in June 2015 and at that time expressed to her his wishes to discontinue Rythmol if possible.  She did not feel comfortable making that decision.  When I  saw him in February 2017, I recommended that he try to wean off Rythmol which he had done over a three-week period.  Since that time, he has been on atenolol 25 mg daily in addition to aspirin 81 mg, omega-3 fatty acids, and Crestor 10 mg.  He is unaware of any breakthrough rhythm disturbance.  He continues to remain stable.  In the past the episodes of paroxysmal atrial fibrillation occurred in the setting of more significant alcohol intake.  When I last saw him in May, he was feeling great and was  exercising regularly.  He works out 5 days per week.  He has remained in good physical shape.  He denies any weight gain.  He denies chest pain.  He denies shortness of breath.  He denies bleeding.  When I saw him, he had 2 recurrent episodes of atrial fibrillation.  The first was in 10/24/2014 when he developed atrial fibrillation while working out.  He presented to the emergency room  and was given eliquis and successfully cardioverted  that day back to sinus rhythm.  He went to the atrial fibrillation clinic in follow-up.  He had a cha2ds2vasc score of 2.  He was felt that he did not require daily medication at that point, particularly with a somewhat low blood pressure.  When he had developed atrial fibrillation with a rapid ventricular response he was hypotensive.  He had a recurrent episode on 03/02/2015 presented to the emergency room with recurrent onset of atrial fibrillation.  Eliquis was reinstituted since he had only taken it for a month after his August cardioversion and he again was successfully cardioverted back to sinus rhythm.  When I saw him in January 2017 because of his recurrent episodes, I recommended initiation of flecainide 50 mg twice a day.  He has tolerated this well and has not had any recurrent episodes.  He underwent an echo Doppler study in January 2017 which showed an EF of 60-65% with grade 2 diastolic dysfunction.  There was mild biatrial enlargement.  PA pressure was 34 mm.  There was trivial MR and TR.  He underwent a routine treadmill test on flexion.  I did make certain he did not have any exercise-induced arrhythmia.  This was normal.  He exercised for 9 minutes and did not develop chest pain or ECG changes.  I saw him in the office in February  2019 and since his prior evaluation in January 2018 he had remained stable.  He was unaware of recurrent atrial fibrillation and continues to be active.  He was going to the gym regularly.   He was evaluated by me in April 2020 in a telemedicine visit.  He continued to be on flecainide 50 mg twice a day, atenolol 12.5 mg daily, Eliquis 5 mg twice a day, and rosuvastatin 10 mg.  He was unaware of any recurrent atrial fibrillation issues.  He continued to be followed by Dr. Dorthey Schmidt for his primary care who checks laboratory.  When I saw him on 10/06/2020 he continued to feel well and was participating in Silver sneakers 3 days/week and also does additional  class 2 days/week of exercise.  He denies chest pain or shortness of breath.  There are no palpitations.  He had undergone laboratory in July 2021 which showed a total cholesterol 159, HDL 63, triglycerides 75, and LDL 82.  Renal function was stable with a creatinine of 0.99.  He denies bleeding on Eliquis.  He was unaware of any recurrent palpitations.  I last saw him on December 05, 2021 and he remained stable.  He stays active and goes to the gym regularly.  He denies any chest pain palpitations, or change in exercise tolerance.  He continues to be on Eliquis 5 mg twice a day for anticoagulation, atenolol 12.5 mg twice a day in addition to flecainide 50 mg twice a day.  He is on rosuvastatin 10 mg.    Since I last saw him, he and his wife and family spent 2 weeks in [place] and [place] in [place].  Over the past year he has remained very active.  He denies any chest pain or shortness of breath.  He sold his large house in [place] and now lives in a smaller home in [place].  He had undergone laboratory on October 15, 2022 which showed total cholesterol 176, triglycerides 88, HDL 67, and LDL 93.  Hemoglobin and hematocrit were stable at 14.4/46.5.  He continues to be followed by Dr. Dorthey Schmidt.  He continues to be on Eliquis for anticoagulation and denies bleeding.  He is unaware of recurrent A-fib and continues to be on atenolol 12.5 mg twice a day and flecainide 50 mg twice a day.  He is on rosuvastatin 10 mg.  He presents for evaluation.  Past Medical History:  Diagnosis Date   Fatigue    Hyperlipidemia    Mitral valve prolapse    Paroxysmal A-fib [facility])     Past Surgical History:  Procedure Laterality Date   CARDIAC CATHETERIZATION  06/19/2004   CARDIOVERSION  04/14/2006   TONSILLECTOMY AND ADENOIDECTOMY      Allergies  Allergen Reactions   Clindamycin/Lincomycin    Penicillins Rash    Has patient had a PCN reaction causing immediate rash, facial/tongue/throat swelling, SOB or  lightheadedness with hypotension: No Has patient had a PCN reaction causing severe rash involving mucus membranes or skin necrosis: No Has patient had a PCN reaction that required hospitalization No Has patient had a PCN reaction occurring within the last 10 years: No If all of the above answers are "NO", then may proceed with Cephalosporin use.    Current Outpatient Medications  Medication Sig Dispense Refill   ammonium lactate (AMLACTIN) 12 % cream Apply 1 Application topically once.     apixaban (ELIQUIS) 5 MG TABS tablet Take 1 tablet (5 mg total) by mouth 2 (two)  times daily.     atenolol (TENORMIN) 25 MG tablet Take 0.5 tablets (12.5 mg total) by mouth 2 (two) times daily. 90 tablet 1   ELIQUIS 5 MG TABS tablet TAKE 1 TABLET BY MOUTH TWICE  DAILY 200 tablet 2   famotidine (PEPCID) 20 MG tablet Take 1 tablet by mouth at bedtime as needed.     flecainide (TAMBOCOR) 50 MG tablet TAKE 1 TABLET BY MOUTH TWICE  DAILY 200 tablet 2   rosuvastatin (CRESTOR) 10 MG tablet TAKE 1 TABLET BY MOUTH DAILY 100 tablet 2   No current facility-administered medications for this visit.    Social History   Socioeconomic History   Marital status: Married    Spouse name: Not on file   Number of children: Not on file   Years of education: Not on file   Highest education level: Not on file  Occupational History   Not on file  Tobacco Use   Smoking status: Never   Smokeless tobacco: Never  Substance and Sexual Activity   Alcohol use: Yes    Alcohol/week: 3.0 - 4.0 standard drinks of alcohol    Types: 3 - 4 Standard drinks or equivalent per week   Drug use: No   Sexual activity: Not on file  Other Topics Concern   Not on file  Social History Narrative   Not on file   Social Determinants of Health   Financial Resource Strain: Not on file  Food Insecurity: Not on file  Transportation Needs: Not on file  Physical Activity: Not on file  Stress: Not on file  Social Connections: Unknown  (10/14/2021)   [facility]   Social Network    Social Network: Not on file  Intimate Partner Violence: Unknown (10/14/2021)   Received from [facility]   HITS    Physically Hurt: Not on file    Insult or Talk Down To: Not on file    Threaten Physical Harm: Not on file    Scream or Curse: Not on file   Socially he is retired.  He has 3 children.  He previously was a Comptroller.  Family history is notable that both parents are deceased.  Father died of a stroke.  ROS General: Negative; No fevers, chills, or night sweats HEENT: Negative; No changes in vision or hearing, sinus congestion, difficulty swallowing Pulmonary: Negative; No cough, wheezing, shortness of breath, hemoptysis Cardiovascular: See HPI: No chest pain, presyncope, syncope, palpatations GI: Negative; No nausea, vomiting, diarrhea, or abdominal pain GU: Negative; No dysuria, hematuria, or difficulty voiding Musculoskeletal: Negative; no myalgias, joint pain, or weakness Hematologic: Negative; no easy bruising, bleeding Endocrine: Negative; no heat/cold intolerance; no diabetes, Neuro: Negative; no changes in balance, headaches Skin: Negative; No rashes or skin lesions Psychiatric: Negative; No behavioral problems, depression Sleep: Negative; No snoring,  daytime sleepiness, hypersomnolence, bruxism, restless legs, hypnogognic hallucinations. Other comprehensive 14 point system review is negative   Physical Exam BP 126/84   Pulse (!) 56   Ht 6\' 2"  (1.88 m)   Wt 190 lb (86.2 kg)   SpO2 99%   BMI 24.39 kg/m    Repeat blood pressure by me was 128/60  Wt Readings from Last 3 Encounters:  01/14/23 190 lb (86.2 kg)  12/05/21 184 lb (83.5 kg)  10/25/21 182 lb 3.2 oz (82.6 kg)   General: Alert, oriented, no distress.  Appears significantly younger than stated age. Skin: normal turgor, no rashes, warm and dry HEENT:  Normocephalic, atraumatic. Pupils equal  round and reactive to light; sclera anicteric; extraocular muscles intact;  Nose without nasal septal hypertrophy Mouth/Parynx benign; Mallinpatti scale 2 Neck: No JVD, no carotid bruits; normal carotid upstroke Lungs: clear to ausculatation and percussion; no wheezing or rales Chest wall: without tenderness to palpitation Heart: PMI not displaced, RRR, s1 s2 normal, 1/6 systolic murmur, no diastolic murmur, no rubs, gallops, thrills, or heaves Abdomen: soft, nontender; no hepatosplenomehaly, BS+; abdominal aorta nontender and not dilated by palpation. Back: no CVA tenderness Pulses 2+ Musculoskeletal: full range of motion, normal strength, no joint deformities Extremities: Left calf varicose veins, unchanged; no clubbing cyanosis or edema, Homan's sign negative  Neurologic: grossly nonfocal; Cranial nerves grossly wnl Psychologic: Normal mood and affect    EKG Interpretation Date/Time:  Tuesday January 14 2023 11:12:30 EST Ventricular Rate:  56 PR Interval:  212 QRS Duration:  86 QT Interval:  424 QTC Calculation: 409 R Axis:   8  Text Interpretation: Sinus bradycardia with 1st degree A-V block Low voltage QRS When compared with ECG of 09-Mar-2015 14:24, T wave amplitude has increased in Anterior leads Confirmed by Gabriel Schmidt ([id number]) on 01/14/2023 11:26:18 AM     December 05, 2021 ECG (independently read by me):  NSR at 63, PR 206 msec, QTc 425 msec  June 06, 2020 ECG (independently read by me): NSR at 61; no ectopy, PR 200 msec; QTc interval 426 msec  March 31, 2017 ECG (independently read by me): Sinus bradycardia 58 bpm.  One isolated PVC.  QTc interval 443 ms.  January 2018 ECG (independently read by me): Normal sinus rhythm at 64 bpm.  QTc interval 427 ms.  PR interval 194 ms.  No significant ST-T changes.  March 2017 ECG (independently read by me): Sinus bradycardia 58 bpm.  QTc interval 418 ms.  No significant ST segment changes.  January 2017 ECG (independently  read by me): Sinus bradycardia at 51 bpm.  PR interval 180 ms; QTc interval 41 ms.  May 2016 ECG (independently read by me): Sinus bradycardia 59 bpm.  QTc interval 423 ms.  PR interval 188 ms  February 2016 ECG (independently read by me): Sinus bradycardia 59 bpm.  Borderline first-degree AV block with a PR interval of 24 ms.  No significant ST segment changes.  LABS:     Latest Ref Rng & Units 03/01/2015    1:45 PM 10/24/2014    1:50 PM 07/13/2014    9:23 AM  BMP  Glucose 65 - 99 mg/dL 99  604  92   BUN 6 - 20 mg/dL 13  16  14    Creatinine 0.61 - 1.24 mg/dL 5.40  9.81  1.91   BUN/Creat Ratio 10 - 22   14   Sodium 135 - 145 mmol/L 142  140  142   Potassium 3.5 - 5.1 mmol/L 4.7  4.2  5.0   Chloride 101 - 111 mmol/L 105  106  103   CO2 22 - 32 mmol/L 30  28  24    Calcium 8.9 - 10.3 mg/dL 9.4  9.1  9.1         Latest Ref Rng & Units 07/13/2014    9:23 AM 08/02/2013    8:35 AM  Hepatic Function  Total Protein 6.0 - 8.5 g/dL 5.9  6.1   Albumin 3.5 - 4.8 g/dL 4.0  4.1   AST 0 - 40 IU/L 20  15   ALT 0 - 44 IU/L 11  10  Alk Phosphatase 39 - 117 IU/L 47  41   Total Bilirubin 0.0 - 1.2 mg/dL 1.0  1.4         Latest Ref Rng & Units 03/01/2015    1:45 PM 10/24/2014    1:50 PM 07/13/2014    9:23 AM  CBC  WBC 4.0 - 10.5 K/uL 6.4  4.9  4.8   Hemoglobin 13.0 - 17.0 g/dL 41.3  24.4  01.0   Hematocrit 39.0 - 52.0 % 46.1  45.7  43.9   Platelets 150 - 400 K/uL 279  260  263    Lab Results  Component Value Date   MCV 89.5 03/01/2015   MCV 90.5 10/24/2014   MCV 90 07/13/2014   Lab Results  Component Value Date   TSH 1.700 07/13/2014    BNP No results found for: "BNP"  ProBNP No results found for: "PROBNP"   Lipid Panel     Component Value Date/Time   CHOL 141 07/13/2014 0923   TRIG 78 07/13/2014 0923   HDL 64 07/13/2014 0923   CHOLHDL 2.7 08/02/2013 0835   VLDL 17 08/02/2013 0835   LDLCALC 61 07/13/2014 0923     RADIOLOGY: No results found.  IMPRESSION:  1. PAF  (paroxysmal atrial fibrillation) (HCC)   2. MVP (mitral valve prolapse)   3. Pure hypercholesterolemia   4. Anticoagulation adequate   5. Asymptomatic varicose veins of left lower extremity     ASSESSMENT AND PLAN: Mr. Gabriel Schmidt is a young appearing 88 year old white male who has a history of paroxysmal atrial fibrillation and had only experienced only 2 prior episodes of atrial fibrillation in over 10 year period.  Remotely, his initial episode that was short lived while Gabriel Schmidt in [place], [place].  He had no further recurrence for over 8 years and in 2016 he requested to be taken off his antiarrhythmic therapy and since he had been so stable Rythmol was discontinued.  He developed several recurrent episodes of atrial fibrillation on antiarrhythmic therapy since August 2016 and underwent cardioversion.  He is now on Eliquis for anticoagulation.  Over the last several years he has continued to do exceptionally well and has not had any recurrent atrial fibrillation episodes.  He continues to be on flecainide 50 mg twice a day and atenolol 12.5 mg twice a day.  ECG today is stable.  QTc interval is normal.  His echo Doppler study in 2017 showed normal systolic function without wall motion abnormality; EF 60 to 65%.  At that time he did have grade 2 diastolic dysfunction and mild atrial enlargement.  He has chronic left calf varicose veins and at times notes trivial ankle edema.  He is followed by Gabriel Schmidt who checks laboratory.  Most recent laboratory from October 22, 2022 was reviewed today.  I discussed with he and his wife my plans for future retirement in 2025.  As long as he remains stable, he will return in 1 year and I will transition him to the care of Dr. Royann Schmidt but I will be happy to see him anytime prior to my retirement.   Gabriel Bihari, MD, [facility]  01/21/2023 1:45 PM

## 2023-01-21 ENCOUNTER — Encounter: Payer: Self-pay | Admitting: Cardiovascular Disease

## 2023-02-04 ENCOUNTER — Telehealth: Payer: Self-pay | Admitting: Cardiovascular Disease

## 2023-02-04 MED ORDER — APIXABAN 5 MG PO TABS: 5.0000 mg | ORAL_TABLET | Freq: Two times a day (BID) | ORAL | Status: DC

## 2023-02-04 NOTE — Telephone Encounter (Signed)
 Patient calling the office for samples of medication:   1.  What medication and dosage are you requesting samples for? apixaban (ELIQUIS) 5 MG TABS tablet  2.  Are you currently out of this medication?   No, but patient states he is in the donut hole.

## 2023-02-04 NOTE — Telephone Encounter (Signed)
Called patient to inform him that 2 boxes of samples of Eliquis 5 mg were left at the front desk and are ready to be picked up.

## 2023-02-09 ENCOUNTER — Other Ambulatory Visit: Payer: Self-pay | Admitting: Cardiovascular Disease

## 2023-03-03 ENCOUNTER — Other Ambulatory Visit: Payer: Self-pay | Admitting: Cardiovascular Disease

## 2023-03-03 DIAGNOSIS — I480 Paroxysmal atrial fibrillation: Secondary | ICD-10-CM

## 2023-03-03 NOTE — Telephone Encounter (Signed)
 Prescription refill request for Eliquis received. Indication:afib Last office visit:11/24 Scr:1.00  8/24 Age: 89 Weight:86.2  kg  Prescription refilled

## 2023-03-29 ENCOUNTER — Other Ambulatory Visit: Payer: Self-pay | Admitting: Cardiovascular Disease

## 2023-03-31 ENCOUNTER — Other Ambulatory Visit: Payer: Self-pay | Admitting: Cardiovascular Disease

## 2023-06-03 ENCOUNTER — Telehealth: Payer: Self-pay | Admitting: Cardiovascular Disease

## 2023-06-03 MED ORDER — FLECAINIDE ACETATE 50 MG PO TABS: 50.0000 mg | ORAL_TABLET | Freq: Two times a day (BID) | ORAL | 2 refills | Status: DC

## 2023-06-03 NOTE — Telephone Encounter (Signed)
*  STAT* If patient is at the pharmacy, call can be transferred to refill team.   1. Which medications need to be refilled? (please list name of each medication and dose if known) flecainide (TAMBOCOR) 50 MG tablet   2. Which pharmacy/location (including street and city if local pharmacy) is medication to be sent to? [id number] - [place], [place] - [address] AT [facility] [address]   3. Do they need a 30 day or 90 day supply? 90

## 2023-06-03 NOTE — Telephone Encounter (Signed)
 Pt's medication was sent to pt's pharmacy as requested. Confirmation received.

## 2023-06-06 ENCOUNTER — Telehealth: Payer: Self-pay | Admitting: Pharmacy Technician

## 2023-06-06 NOTE — Telephone Encounter (Signed)
 Faxed information request by fax for tier exception

## 2023-06-09 ENCOUNTER — Telehealth: Payer: Self-pay | Admitting: Cardiovascular Disease

## 2023-06-09 ENCOUNTER — Other Ambulatory Visit (HOSPITAL_COMMUNITY): Payer: Self-pay

## 2023-06-09 MED ORDER — FLECAINIDE ACETATE 50 MG PO TABS: 50.0000 mg | ORAL_TABLET | Freq: Two times a day (BID) | ORAL | 0 refills | Status: DC

## 2023-06-09 NOTE — Telephone Encounter (Signed)
 Called and spoke to [employer] pharmacist at [employer] states:   -when attempting to prescribe message states Dr. Loetta Ringer is a state restricted provider   -NPI number is [id number]  -patient only has 1 dose left, would need prescription went today   -Ticket will be submitted

## 2023-06-09 NOTE — Telephone Encounter (Signed)
 Pt c/o medication issue:  1. Name of Medication: flecainide (TAMBOCOR) 50 MG tablet   2. How are you currently taking this medication (dosage and times per day)?   3. Are you having a reaction (difficulty breathing--STAT)? No  4. What is your medication issue? Per pharmacy, Dr Loetta Ringer is being flagged stating -state restricted prescriber, prescriber is restricted from prescribing med. Requesting cb, will put med on hold until then

## 2023-06-09 NOTE — Telephone Encounter (Signed)
 Spoke to DOD Dr. Chancy Comber  Dr. Chancy Comber states:   -Send 30 day supply   -have Dr. Loetta Ringer follow up regarding definitive plan   -Have Triage follow up in 3 weeks to check if issue resolved

## 2023-06-09 NOTE — Telephone Encounter (Signed)
 Patient identification verified by 2 forms. Hilton Lucky, RN    Called and spoke to patient  Informed patient:   -due to current issue with prescribing 30 day supply sent for Flecainide   -RN will follow up with Dr. Loetta Ringer regarding long term solution   -Rx should be available for pick up today  Patient verbalized understanding, no questions at this time   ______________________________________________________________________________   Liana Reding and spoke to [facility] pharmacy  Pharmacy states:   -was able to process Rx through insurance at no charge for the 30 day supply   -will be available to pick up after 3 pm

## 2023-06-09 NOTE — Telephone Encounter (Signed)
 Tier exception approved 02/26/23-- 02/25/24 Too soon for test claim:

## 2023-06-20 NOTE — Telephone Encounter (Signed)
 Pt is calling to make sure this is taking care of for future refills. Please advise

## 2023-06-27 ENCOUNTER — Telehealth: Payer: Self-pay | Admitting: Cardiovascular Disease

## 2023-06-27 MED ORDER — FLECAINIDE ACETATE 50 MG PO TABS: 50.0000 mg | ORAL_TABLET | Freq: Two times a day (BID) | ORAL | 1 refills | Status: DC

## 2023-06-27 MED ORDER — FLECAINIDE ACETATE 50 MG PO TABS: 50.0000 mg | ORAL_TABLET | Freq: Two times a day (BID) | ORAL | 0 refills | Status: DC

## 2023-06-27 NOTE — Telephone Encounter (Signed)
 Pt's medication was sent to pt's pharmacy as requested. Confirmation received.

## 2023-06-27 NOTE — Telephone Encounter (Signed)
*  STAT* If patient is at the pharmacy, call can be transferred to refill team.   1. Which medications need to be refilled? (please list name of each medication and dose if known)   flecainide  (TAMBOCOR ) 50 MG tablet   2. Would you like to learn more about the convenience, safety, & potential cost savings by using the [facility]?   3. Are you open to using the [facility] (Type [facility]. ).  4. Which pharmacy/location (including street and city if local pharmacy) is medication to be sent to?  [id number] - [place], [place] - [address] AT [facility] [address]   5. Do they need a 30 day or 90 day supply?   90 day  Patient stated he still has some medication.  Patient wants a call back to confirm medication has been sent.

## 2023-06-27 NOTE — Addendum Note (Signed)
 Addended by: Gayleen Kawasaki D on: 06/27/2023 11:09 AM   Modules accepted: Orders

## 2023-08-30 ENCOUNTER — Other Ambulatory Visit: Payer: Self-pay | Admitting: Cardiovascular Disease

## 2023-08-30 DIAGNOSIS — I480 Paroxysmal atrial fibrillation: Secondary | ICD-10-CM

## 2023-09-01 NOTE — Telephone Encounter (Signed)
 Prescription refill request for Eliquis  received. Indication: PAF Last office visit: 01/14/23  ONEIDA Sor MD Scr:  1.00 on 10/15/22  Epic Age: 89 Weight: 86.2kg  Based on above findings Eliquis  5mg  twice daily is the appropriate dose.  Refill approved.

## 2023-11-15 IMAGING — DX DG LUMBAR SPINE 2-3V
3 series · 3 of 3 positions shown · non-contrast
Comparison: None.

CLINICAL DATA: Low back pain

EXAM:
LUMBAR SPINE - 2-3 VIEW

[dg lumbar spine 2-3 views (1 of 3)]
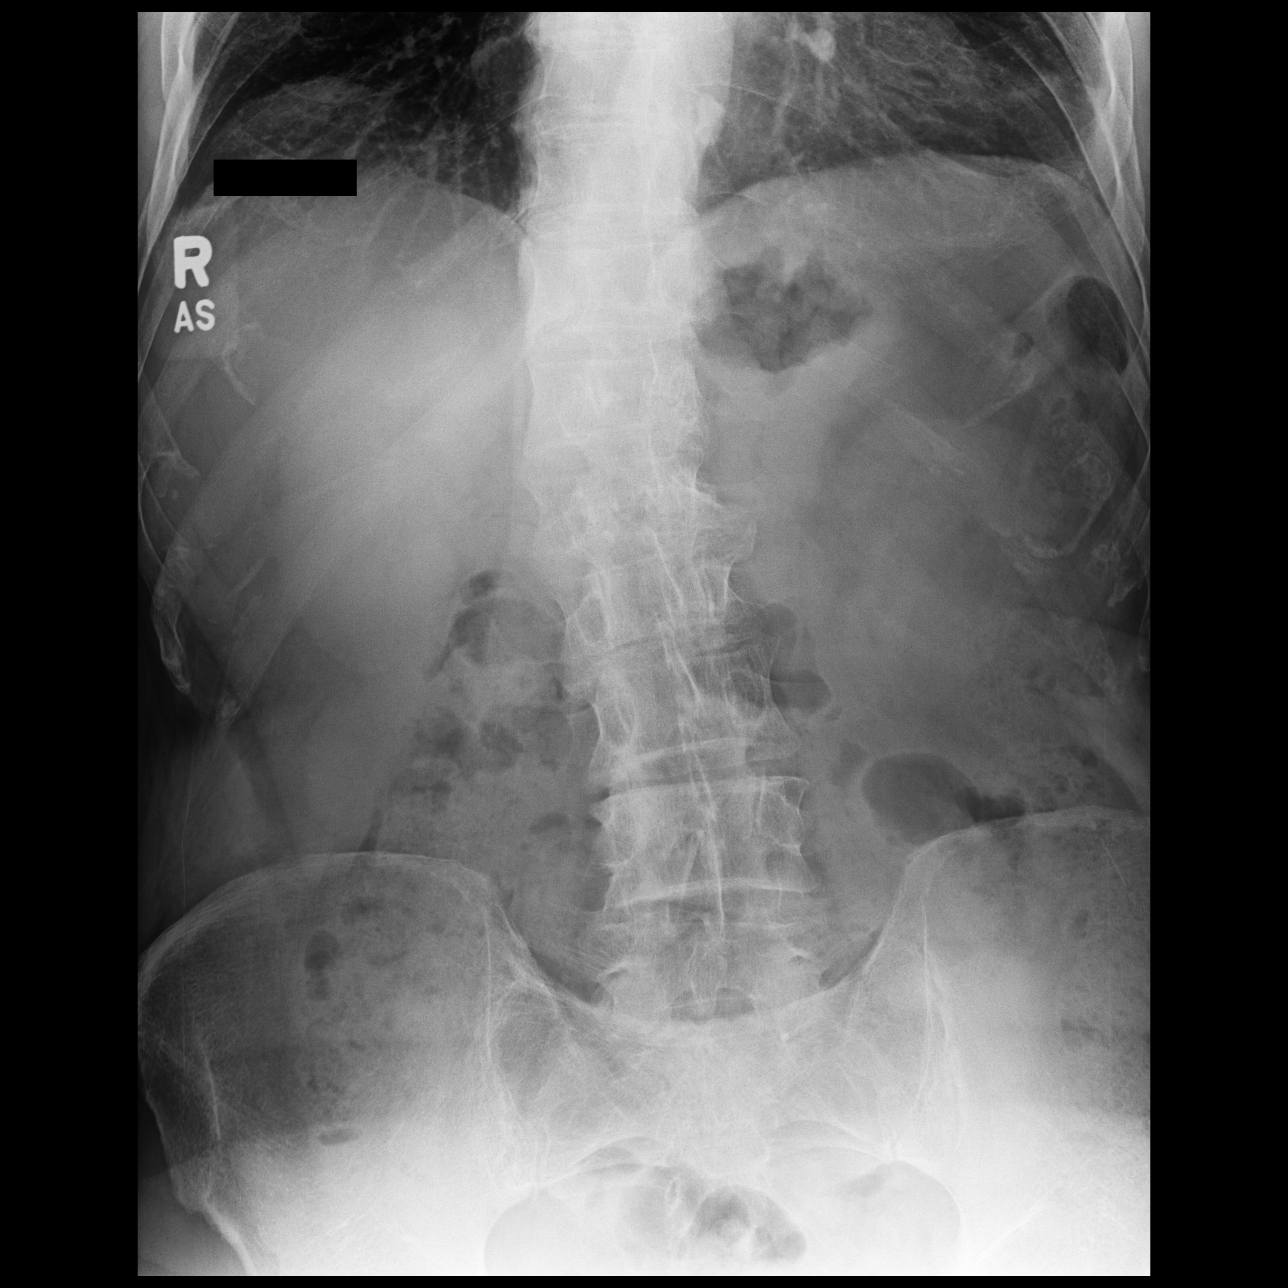

[dg lumbar spine 2-3 views (2 of 3)]
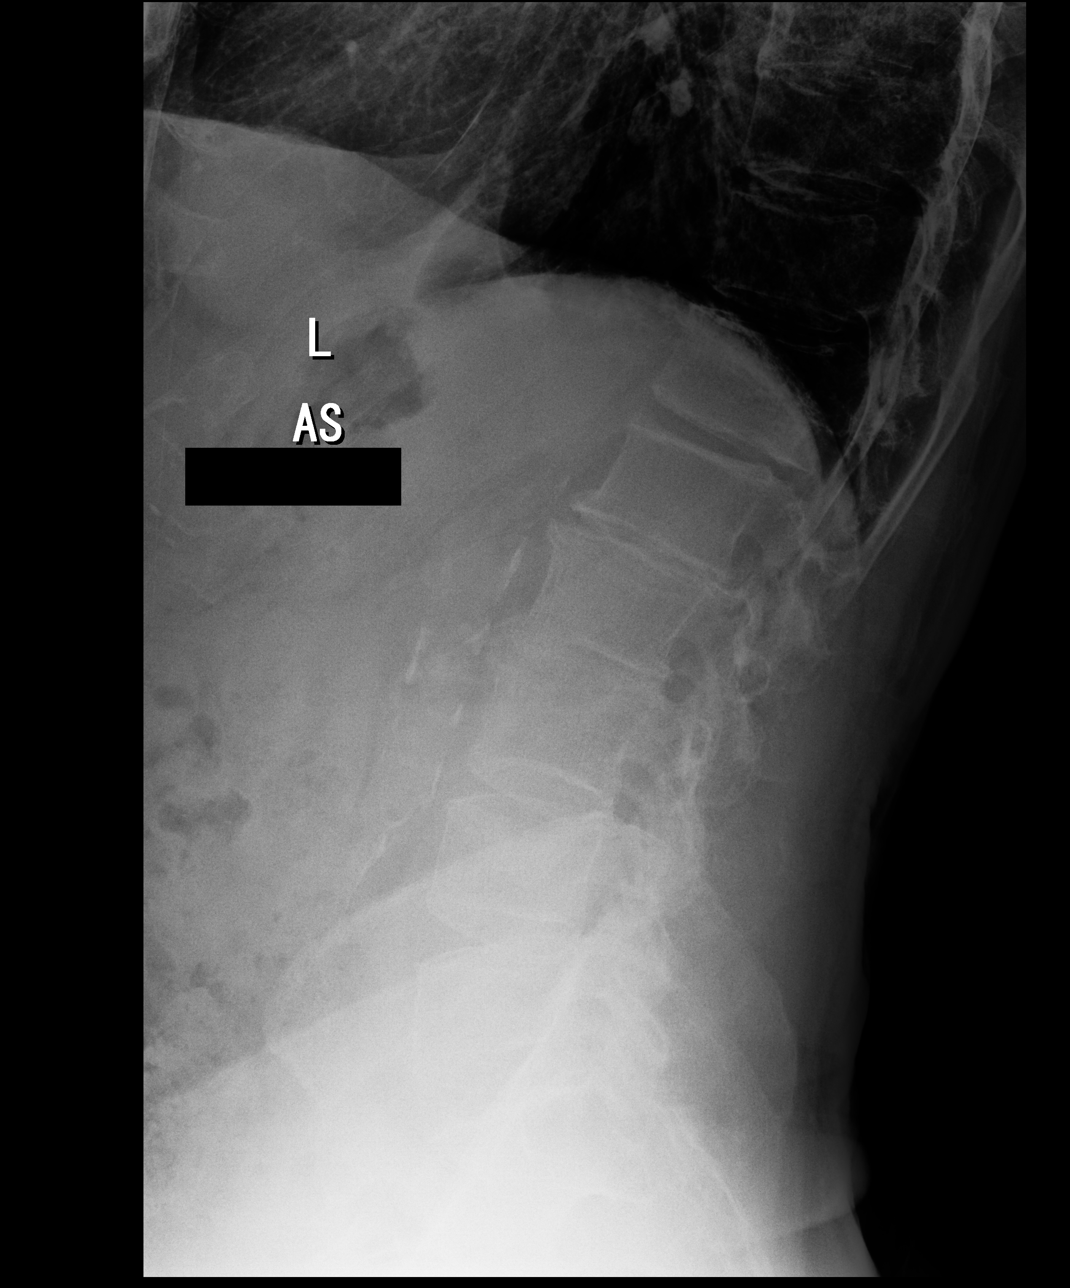

[dg lumbar spine 2-3 views (3 of 3)]
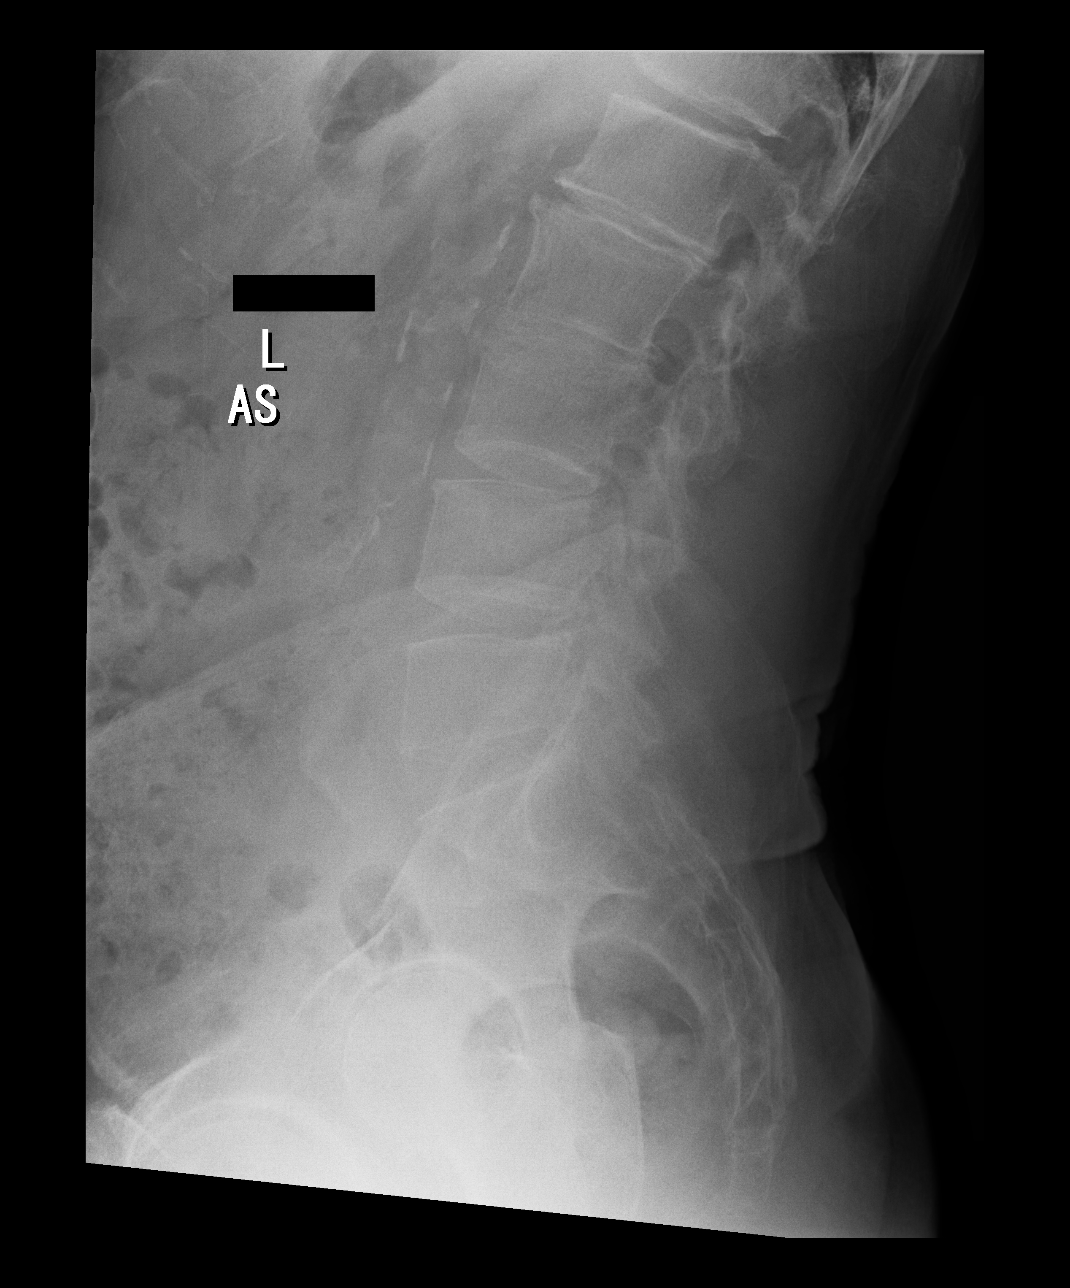

[3 of 3 positions shown; findings below may reference images not displayed]

FINDINGS: No recent fracture is seen. There is mild dextroscoliosis.
Degenerative changes are noted with disc space narrowing, bony spurs
and facet hypertrophy at multiple levels, more prominent at L1-L2
and L2-L3 levels. Alignment of posterior margins of vertebral bodies
is unremarkable.
IMPRESSION: No recent fracture is seen. Degenerative changes are noted in the
lumbar spine, more severe at L1-L2 and L2-L3 levels.
Dextroscoliosis.

## 2023-11-19 ENCOUNTER — Other Ambulatory Visit: Payer: Self-pay

## 2023-11-19 ENCOUNTER — Emergency Department (HOSPITAL_BASED_OUTPATIENT_CLINIC_OR_DEPARTMENT_OTHER)
Admission: EM | Admit: 2023-11-19 | Discharge: 2023-11-19 | Disposition: A | Discharge status: Home / Self Care | Payer: Medicare (Managed Care) | Attending: Emergency Medicine | Admitting: Emergency Medicine

## 2023-11-19 ENCOUNTER — Telehealth: Payer: Self-pay | Admitting: Physician Assistant

## 2023-11-19 ENCOUNTER — Encounter (HOSPITAL_BASED_OUTPATIENT_CLINIC_OR_DEPARTMENT_OTHER): Payer: Self-pay | Admitting: Emergency Medicine

## 2023-11-19 DIAGNOSIS — I480 Paroxysmal atrial fibrillation: Secondary | ICD-10-CM | POA: Insufficient documentation

## 2023-11-19 DIAGNOSIS — Z7901 Long term (current) use of anticoagulants: Secondary | ICD-10-CM | POA: Diagnosis not present

## 2023-11-19 DIAGNOSIS — R002 Palpitations: Secondary | ICD-10-CM | POA: Diagnosis present

## 2023-11-19 LAB — BASIC METABOLIC PANEL WITH GFR
Anion gap: 11 (ref 5–15)
BUN: 22 mg/dL (ref 8–23)
CO2: 24 mmol/L (ref 22–32)
Calcium: 9.1 mg/dL (ref 8.9–10.3)
Chloride: 106 mmol/L (ref 98–111)
Creatinine, Ser: 1.07 mg/dL (ref 0.61–1.24)
GFR, Estimated: >60 mL/min (ref >60)
Glucose, Bld: 106 mg/dL — ABNORMAL HIGH (ref 70–99)
Potassium: 4.4 mmol/L (ref 3.5–5.1)
Sodium: 141 mmol/L (ref 135–145)

## 2023-11-19 LAB — CBC WITH DIFFERENTIAL/PLATELET
Abs Immature Granulocytes: 0.01 K/uL (ref 0.00–0.07)
Basophils Absolute: 0 K/uL (ref 0.0–0.1)
Basophils Relative: 0 %
Eosinophils Absolute: 0.1 K/uL (ref 0.0–0.5)
Eosinophils Relative: 3 %
HCT: 39.7 % (ref 39.0–52.0)
Hemoglobin: 13.6 g/dL (ref 13.0–17.0)
Immature Granulocytes: 0 %
Lymphocytes Relative: 25 %
Lymphs Abs: 1.1 K/uL (ref 0.7–4.0)
MCH: 32.6 pg (ref 26.0–34.0)
MCHC: 34.3 g/dL (ref 30.0–36.0)
MCV: 95.2 fL (ref 80.0–100.0)
Monocytes Absolute: 0.9 K/uL (ref 0.1–1.0)
Monocytes Relative: 22 %
Neutro Abs: 2.1 K/uL (ref 1.7–7.7)
Neutrophils Relative %: 50 %
Platelets: 220 K/uL (ref 150–400)
RBC: 4.17 MIL/uL — ABNORMAL LOW (ref 4.22–5.81)
RDW: 13.1 % (ref 11.5–15.5)
WBC: 4.2 K/uL (ref 4.0–10.5)
nRBC: 0 % (ref 0.0–0.2)

## 2023-11-19 NOTE — ED Provider Notes (Signed)
  EMERGENCY DEPARTMENT AT [facility] Provider Note   CSN: [id number] Arrival date & time: 11/19/23  2002     Patient presents with: Palpitations   Gabriel Schmidt is a 89 y.o. male.   Patient is a 89 year old male who presents with palpitations.  He has a history of atrial fibrillation and is on Eliquis .  He said this morning he has felt intermittently a little lightheaded and felt his heart rate and discover that he was in A-fib.  He denies any chest discomfort or shortness of breath.  He took an extra dose of his metoprolol and contacted his cardiologist who advised him if his heart rate remained above 120 to come into the ED.  Otherwise he can follow-up as an outpatient.  He is on Eliquis .  He has not missed any doses.  No recent illnesses.  He has been under a lot of stress because his daughter was recently diagnosed with a recurrence of her non-Hodgkin's lymphoma.       Prior to Admission medications   Medication Sig Start Date End Date Taking? Authorizing Provider  ammonium lactate (AMLACTIN) 12 % cream Apply 1 Application topically once. 08/09/21   [provider]  apixaban  (ELIQUIS ) 5 MG TABS tablet Take 1 tablet (5 mg total) by mouth 2 (two) times daily. 01/14/23   Burnard Debby LABOR, MD  apixaban  (ELIQUIS ) 5 MG TABS tablet Take 1 tablet (5 mg total) by mouth 2 (two) times daily. 02/04/23   Burnard Debby LABOR, MD  atenolol  (TENORMIN ) 25 MG tablet TAKE 1/2 TABLET(12.5 MG) BY MOUTH TWICE DAILY 02/10/23   Burnard Debby LABOR, MD  ELIQUIS  5 MG TABS tablet TAKE 1 TABLET(5 MG) BY MOUTH TWICE DAILY 09/01/23   Croitoru, Mihai, MD  famotidine (PEPCID) 20 MG tablet Take 1 tablet by mouth at bedtime as needed.    [provider]  flecainide  (TAMBOCOR ) 50 MG tablet Take 1 tablet (50 mg total) by mouth 2 (two) times daily. 06/27/23   Burnard Debby LABOR, MD  rosuvastatin  (CRESTOR ) 10 MG tablet TAKE 1 TABLET BY MOUTH DAILY 03/31/23   Burnard Debby LABOR, MD    Allergies:  Clindamycin/lincomycin and Penicillins    Review of Systems  Constitutional:  Positive for fatigue. Negative for chills, diaphoresis and fever.  HENT:  Negative for congestion, rhinorrhea and sneezing.   Eyes: Negative.   Respiratory:  Negative for cough, chest tightness and shortness of breath.   Cardiovascular:  Positive for palpitations. Negative for chest pain and leg swelling.  Gastrointestinal:  Negative for abdominal pain, diarrhea, nausea and vomiting.  Genitourinary:  Negative for difficulty urinating, flank pain and frequency.  Musculoskeletal:  Negative for arthralgias and back pain.  Skin:  Negative for rash.  Neurological:  Positive for light-headedness. Negative for speech difficulty, weakness, numbness and headaches.    Updated Vital Signs BP 117/60   Pulse 64   Temp 98.1 F (36.7 C) (Oral)   Resp 11   Ht 6' 2 (1.88 m)   Wt 86.2 kg   SpO2 94%   BMI 24.39 kg/m   Physical Exam Constitutional:      Appearance: He is well-developed.  HENT:     Head: Normocephalic and atraumatic.  Eyes:     Pupils: Pupils are equal, round, and reactive to light.  Cardiovascular:     Rate and Rhythm: Normal rate. Rhythm irregular.     Heart sounds: Normal heart sounds.  Pulmonary:     Effort: Pulmonary effort is  normal. No respiratory distress.     Breath sounds: Normal breath sounds. No wheezing or rales.  Chest:     Chest wall: No tenderness.  Abdominal:     General: Bowel sounds are normal.     Palpations: Abdomen is soft.     Tenderness: There is no abdominal tenderness. There is no guarding or rebound.  Musculoskeletal:        General: Normal range of motion.     Cervical back: Normal range of motion and neck supple.     Comments: No edema or calf tenderness  Lymphadenopathy:     Cervical: No cervical adenopathy.  Skin:    General: Skin is warm and dry.     Findings: No rash.  Neurological:     Mental Status: He is alert and oriented to person, place, and time.      (all labs ordered are listed, but only abnormal results are displayed) Labs Reviewed  BASIC METABOLIC PANEL WITH GFR - Abnormal; Notable for the following components:      Result Value   Glucose, Bld 106 (*)    All other components within normal limits  CBC WITH DIFFERENTIAL/PLATELET - Abnormal; Notable for the following components:   RBC 4.17 (*)    All other components within normal limits    EKG: EKG Interpretation Date/Time:  Wednesday November 19 2023 20:21:47 EDT Ventricular Rate:  110 PR Interval:    QRS Duration:  88 QT Interval:  353 QTC Calculation: 430 R Axis:   -9  Text Interpretation: Atrial fibrillation Abnormal R-wave progression, early transition Confirmed by Lenor Hollering [id number]) on 11/19/2023 8:32:17 PM  Radiology: No results found.   Procedures   Medications Ordered in the ED - No data to display                                  Medical Decision Making Amount and/or Complexity of Data Reviewed Labs: ordered.   This patient presents to the ED for concern of irregular heartbeat, this involves an extensive number of treatment options, and is a complaint that carries with it a high risk of complications and morbidity.  I considered the following differential and admission for this acute, potentially life threatening condition.  The differential diagnosis includes atrial fibrillation, other arrhythmia, electrolyte abnormality, ACS, pneumonia  MDM:    Patient is a 89 year old who presents with palpitations.  He has a little bit of lightheadedness and noted that his heart rate was irregular and that he is in A-fib.  He has had this happen before.  The last time he had to be cardioverted was in 2017 per chart review.  He is minimally symptomatic.  His heart rate is in the 90s.  He did take an extra dose of his metoprolol at home.  Labs reviewed and are nonconcerning.  No significant electrolyte abnormality.  Discussed with the cardiologist on-call, Dr.  Melia who recommends going ahead and cardioverting the patient.  I was about to go in and discussed cardioversion with the patient and he converted spontaneously to a sinus rhythm.  He is having a fair amount of PACs but it appears to be sinus.  He was discharged home in good condition.  Was encouraged to have close follow-up with his cardiologist.  Return precautions were given.  (Labs, imaging, consults)  Labs: I Ordered, and personally interpreted labs.  The pertinent results include: Electrolytes okay, normal creatinine, no  significant anemia  Imaging Studies ordered: I ordered imaging studies including none I independently visualized and interpreted imaging. I agree with the radiologist interpretation  Additional history obtained from wife.  External records from outside source obtained and reviewed including history  Cardiac Monitoring: The patient was maintained on a cardiac monitor.  If on the cardiac monitor, I personally viewed and interpreted the cardiac monitored which showed an underlying rhythm of: Atrial fibrillation  Reevaluation: After the interventions noted above, I reevaluated the patient and found that they have :improved  Social Determinants of Health:    Disposition: Discharged to home  Co morbidities that complicate the patient evaluation  Past Medical History:  Diagnosis Date   Fatigue    Hyperlipidemia    Mitral valve prolapse    Paroxysmal A-fib (HCC)      Medicines No orders of the defined types were placed in this encounter.   I have reviewed the patients home medicines and have made adjustments as needed  Problem List / ED Course: Problem List Items Addressed This Visit   None Visit Diagnoses       Paroxysmal atrial fibrillation [facility])    -  Primary                Final diagnoses:  Paroxysmal atrial fibrillation [facility])    ED Discharge Orders     None          Lenor Hollering, MD 11/19/23 2228

## 2023-11-19 NOTE — ED Triage Notes (Signed)
 Pt reports he thinks he has been in afib since around lunchtime; denies pain, SHOB; feels a little woozy at times

## 2023-11-19 NOTE — Discharge Instructions (Addendum)
 Make an appointment to have close follow-up with your cardiologist.  Return to the emergency room if you have any worsening symptoms.

## 2023-11-19 NOTE — Telephone Encounter (Signed)
 Patient contacted cardiology office concerning for recurrent A-fib.  He is flecainide , atenolol  and Eliquis  and has been compliant with his medication.  Around lunchtime he has started having intermittent dizziness and he checked his heart rate and it was in the 120s to 130s.  He is usually on atenolol  12.5 mg twice a day, earlier he took a extra 25 mg atenolol  on top of his usual dose.  I instructed the patient to wait about 2 hours, if the heart rate is still above 120 bpm, he has been instructed to go to the [facility].  He would be a good candidate to consider ED cardioversion since he has been compliant with the blood thinner.  However if his heart rate is less than 120 bpm and he feels fine after taking a higher dose of the atenolol , it is okay to hold off on going to the ED and to call our office tomorrow morning to try to work him in early so we can arrange outpatient cardioversion.

## 2023-11-25 ENCOUNTER — Telehealth: Payer: Self-pay | Admitting: Physician Assistant

## 2023-11-25 NOTE — Telephone Encounter (Signed)
 Spoke to patient - appointment schedule for 11/26/23 at 8:30 with afib clinic  Patient is aware

## 2023-11-25 NOTE — Telephone Encounter (Signed)
 Patient c/o Palpitations: STAT if patient c/o lightheadedness, shortness of breath, or chest pain  How long have you had palpitations/irregular HR/ Afib? Are you having the symptoms now?  Irregular HR per [facility]  Are you currently experiencing lightheadedness, SOB or CP?  Patient feels chest pressure  Do you have a history of afib (atrial fibrillation) or irregular heart rhythm?    Have you checked your BP or HR? (document readings if available):  HR has been in the 80-90's 137/67-68 just a little while ago  Are you experiencing any other symptoms?  Mild chest pressure, no pain.   Pt c/o of Chest Pain: STAT if active (IN THIS MOMENT) CP, including tightness, pressure, jaw pain, shoulder/upper arm/back pain, SOB, nausea, and vomiting.  1. Are you having CP right now (tightness, pressure, or discomfort)?  Patient feels chest pressure. Denies having any pain.  2. Are you experiencing any other symptoms (ex. SOB, nausea, vomiting, sweating)?  No   3. How long have you been experiencing CP?  Pressure developed this morning  4. Is your CP continuous or coming and going?  Continuous   5. Have you taken Nitroglycerin?  No

## 2023-11-25 NOTE — Progress Notes (Unsigned)
 [facility] CONSULT NOTE  Patient Care Team: Okey Carlin Redbird, MD as PCP - General (Family Medicine)   ASSESSMENT & PLAN 89 y.o.male with history of PAF on apixaban  being seen for monocytosis.    Patient has normal absolute monocytes, lymphocytes and neutrophils.  No anemia and thrombocytopenia.  We discussed results today.  No concerning symptoms or physical findings on presentation.  Patient may continue follow-up with PCP and check CBC annually, or as indicated.  Patient may follow-up with us  as needed.  Gabriel JAYSON Chihuahua, MD 11/27/2023 10:56 AM   CHIEF COMPLAINTS/PURPOSE OF CONSULTATION:  Leukocytosis   HISTORY OF PRESENTING ILLNESS:  Gabriel Schmidt 89 y.o. male is here because of elevated WBC.  Patient was referred from [facility] physicians.  Outside records show history of atrial fibrillation on anticoagulation, hypercholesterolemia, GERD.    Patient is here for elevated percentage of monocytes.  On review of system, he feels well.  He denies any night sweats, weight loss, decreased appetite, lymphadenopathy, or palpable mass.  No active chest pain, coughing, shortness of breath.  No abdominal pain, nausea, vomiting, diarrhea, constipation, stool caliber changes, bloody stool, melena.  No difficulty urinating or hematuria.  He denies any other symptoms and is otherwise feeling well.  He denies any smoking.  He drinks few times a day week but no more than 5 drinks per week.  Outside labs dated: 04/18/16 WBC 4.6 hemoglobin 14.8 MCV 91 platelet 273 absolute monocyte 0.5 ANC 2.3 ALC 1.5 09/09/19 WBC 4.4 hemoglobin 14.7 MCV 91 platelet 280 absolute monocyte 0.5 absolute neutrophil 2.4 absolute lymphocyte 1.2 09/19/20 WBC 3.9 hemoglobin 14.5 MCV 90 platelet 279 absolute monocyte 0.5 absolute neutrophil 2.2 absolute lymphocyte 1.1 09/27/21 WBC 4.0 hemoglobin 14 MCV 91 platelet 284.  Absolute monocyte 0.5 absolute neutrophil 2.2.  Absolute lymphocyte 1.1. 10/15/2022 WBC 4.4 hemoglobin  14.4 MCV 93 platelet 268 absolute monocyte 0.5 absolute neutrophil 2.4 absolute lymphocyte 1.2  10/30/23 showed normal BUN, creatinine, calcium , total protein and albumin.  Borderline elevated bilirubin at 1.2.  WBC of 3.5, MCV 96.  Hemoglobin 13.8, platelet 228.  Monocyte percentage 21.4%.  Absolute monocyte was 0.7.  Absolute neutrophil 1.7 and absolute lymphocyte of 0.9.  From 11/19/2023 [place] record showed WBC 4.2 hemoglobin 13.6. MCV 95.  Platelet 220 absolute monocyte 0.9.  ANC 2.1 ALC 1.1.   MEDICAL HISTORY:  Past Medical History:  Diagnosis Date   Fatigue    Hyperlipidemia    Mitral valve prolapse    Paroxysmal A-fib (HCC)     SURGICAL HISTORY: Past Surgical History:  Procedure Laterality Date   CARDIAC CATHETERIZATION  06/19/2004   CARDIOVERSION  04/14/2006   TONSILLECTOMY AND ADENOIDECTOMY      SOCIAL HISTORY: Social History   Socioeconomic History   Marital status: Married    Spouse name: Not on file   Number of children: Not on file   Years of education: Not on file   Highest education level: Not on file  Occupational History   Not on file  Tobacco Use   Smoking status: Never   Smokeless tobacco: Never   Tobacco comments:    Never smoked 10/01/225  Substance and Sexual Activity   Alcohol use: Yes    Alcohol/week: 4.0 - 5.0 standard drinks of alcohol    Types: 4 - 5 Cans of beer per week    Comment: 4-5 per week 11/26/23   Drug use: No   Sexual activity: Not on file  Other Topics Concern  Not on file  Social History Narrative   Not on file   Social Drivers of Health   Financial Resource Strain: Not on file  Food Insecurity: No Food Insecurity (11/27/2023)   Hunger Vital Sign    Worried About Running Out of Food in the Last Year: Never true    Ran Out of Food in the Last Year: Never true  Transportation Needs: No Transportation Needs (11/27/2023)   PRAPARE - Administrator, Civil Service (Medical): No    Lack of Transportation  (Non-Medical): No  Physical Activity: Not on file  Stress: Not on file  Social Connections: Unknown (10/14/2021)   Received from [facility]   Social Network    Social Network: Not on file  Intimate Partner Violence: Not At Risk (11/27/2023)   Humiliation, Afraid, Rape, and Kick questionnaire    Fear of Current or Ex-Partner: No    Emotionally Abused: No    Physically Abused: No    Sexually Abused: No    FAMILY HISTORY: Family History  Problem Relation Age of Onset   Stroke Father     ALLERGIES:  is allergic to clindamycin/lincomycin and penicillins.  MEDICATIONS:  Current Outpatient Medications  Medication Sig Dispense Refill   ammonium lactate (AMLACTIN) 12 % cream Apply 1 Application topically once.     atenolol  (TENORMIN ) 25 MG tablet TAKE 1/2 TABLET(12.5 MG) BY MOUTH TWICE DAILY (Patient taking differently: Take 12.5 mg by mouth daily.) 90 tablet 3   ELIQUIS  5 MG TABS tablet TAKE 1 TABLET(5 MG) BY MOUTH TWICE DAILY 180 tablet 1   famotidine (PEPCID) 20 MG tablet Take 1 tablet by mouth at bedtime as needed.     flecainide  (TAMBOCOR ) 50 MG tablet Take 1 tablet (50 mg total) by mouth 2 (two) times daily. 180 tablet 1   No current facility-administered medications for this visit.    REVIEW OF SYSTEMS:   All relevant systems were reviewed with the patient and are negative.  PHYSICAL EXAMINATION: ECOG PERFORMANCE STATUS: 0 - Asymptomatic  Vitals:   11/27/23 1030  BP: (!) 122/56  Pulse: (!) 54  Resp: 20  Temp: 97.7 F (36.5 C)  SpO2: 96%   Filed Weights   11/27/23 1030  Weight: 193 lb 3.2 oz (87.6 kg)    GENERAL: alert, no distress and comfortable SKIN: skin color normal, no jaundice EYES: sclera clear NECK: supple, non-tender, without nodularity LYMPH:  no palpable cervical, axillary lymphadenopathy LUNGS: clear to auscultation and no wheezes, rales and with normal breathing effort HEART: Irregularly irregular ABDOMEN: abdomen soft, non-tender and  nondistended Musculoskeletal: and no lower extremity edema  LABORATORY DATA:  I have reviewed the data as listed Recent Results (from the past 2160 hours)  Basic metabolic panel     Status: Abnormal   Collection Time: 11/19/23  8:33 PM  Result Value Ref Range   Sodium 141 135 - 145 mmol/L   Potassium 4.4 3.5 - 5.1 mmol/L   Chloride 106 98 - 111 mmol/L   CO2 24 22 - 32 mmol/L   Glucose, Bld 106 (H) 70 - 99 mg/dL    Comment: Glucose reference range applies only to samples taken after fasting for at least 8 hours.   BUN 22 8 - 23 mg/dL   Creatinine, Ser 8.92 0.61 - 1.24 mg/dL   Calcium  9.1 8.9 - 10.3 mg/dL   GFR, Estimated >39 >39 mL/min    Comment: (NOTE) Calculated using the CKD-EPI Creatinine Equation (2021)    Anion  gap 11 5 - 15    Comment: Performed at [facility], [address] [place], [place] [address]  CBC with Differential     Status: Abnormal   Collection Time: 11/19/23  8:33 PM  Result Value Ref Range   WBC 4.2 4.0 - 10.5 K/uL   RBC 4.17 (L) 4.22 - 5.81 MIL/uL   Hemoglobin 13.6 13.0 - 17.0 g/dL   HCT 60.2 60.9 - 47.9 %   MCV 95.2 80.0 - 100.0 fL   MCH 32.6 26.0 - 34.0 pg   MCHC 34.3 30.0 - 36.0 g/dL   RDW 86.8 88.4 - 84.4 %   Platelets 220 150 - 400 K/uL   nRBC 0.0 0.0 - 0.2 %   Neutrophils Relative % 50 %   Neutro Abs 2.1 1.7 - 7.7 K/uL   Lymphocytes Relative 25 %   Lymphs Abs 1.1 0.7 - 4.0 K/uL   Monocytes Relative 22 %   Monocytes Absolute 0.9 0.1 - 1.0 K/uL   Eosinophils Relative 3 %   Eosinophils Absolute 0.1 0.0 - 0.5 K/uL   Basophils Relative 0 %   Basophils Absolute 0.0 0.0 - 0.1 K/uL   Immature Granulocytes 0 %   Abs Immature Granulocytes 0.01 0.00 - 0.07 K/uL    Comment: Performed at [facility], [address] [place], [place] [address]   Available outside records reviewed.

## 2023-11-25 NOTE — Telephone Encounter (Signed)
 HR is controlled, does not sound too bad. Can we get them an AFib clinic appt?

## 2023-11-25 NOTE — Telephone Encounter (Signed)
 Patient states  last Wed- had an afib episode - went to [facility] ER ( 9 pm- 10 pm)  he took an atenolol  prior to the ER visit. He converted to sinus.  Patient sates  -yesterday morning  after lunch pulse has been irregular  ( [employer] mobile does not indicate afib , but irregular rhythm. Patient states he does not know how to download the strips.  He has taken morning meds - He takes atenolol  at night.  B/p 137/68  pulse 80 - 90    He states he has little pressure around his heart.   RN defer to Dr Francyne who is DOD 2   Mr Gannett is former patient  of Dr Burnard - patient will be seeing Dr C in 12/205 . He also has an appt f/u in Oct 2025 with LOIS Louder to f/u recent ER visit

## 2023-11-26 ENCOUNTER — Encounter (HOSPITAL_COMMUNITY): Payer: Self-pay | Admitting: Internal Medicine

## 2023-11-26 ENCOUNTER — Ambulatory Visit (HOSPITAL_COMMUNITY)
Admission: RE | Admit: 2023-11-26 | Discharge: 2023-11-26 | Disposition: A | Discharge status: Home / Self Care | Payer: Medicare (Managed Care) | Source: Ambulatory Visit | Attending: Internal Medicine | Admitting: Internal Medicine

## 2023-11-26 VITALS — BP 114/70 | HR 60 | Ht 74.0 in | Wt 192.4 lb

## 2023-11-26 DIAGNOSIS — Z79899 Other long term (current) drug therapy: Secondary | ICD-10-CM

## 2023-11-26 DIAGNOSIS — I480 Paroxysmal atrial fibrillation: Secondary | ICD-10-CM | POA: Diagnosis not present

## 2023-11-26 DIAGNOSIS — D6869 Other thrombophilia: Secondary | ICD-10-CM | POA: Diagnosis not present

## 2023-11-26 DIAGNOSIS — Z5181 Encounter for therapeutic drug level monitoring: Secondary | ICD-10-CM

## 2023-11-26 NOTE — Progress Notes (Signed)
 Patient ID: Gabriel Schmidt, male   DOB: [birth year], 89 y.o.   MRN: [id number]     Primary Care Physician: Okey Carlin Redbird, MD Referring Physician: [facility] Cardiologist: formerly Dr. Burnard Lamar E Cordial is a 89 y.o. male with a h/o infrequent episodes of afib, '06,'08, 8/16 and 03/01/15 with RVR, Usually  afib episodes are complicated with hypotension with a systolic BP around 80. He was successfully cardioverted and was seen f/u  with Dr. Burnard at which time flecainide  50 mg bid was started.   On follow up 11/26/23, patient is currently in NSR. Seen in ED on 9/24 for Afib with RVR. Took additional dose of metoprolol and patient spontaneously converted to NSR while in ED. No missed doses of flecainide  or Eliquis . Patient notes he has been taking one tablet of atenolol  (states half tablet on med list). This is patient's first episode of Afib in years. It is noted that acute stressful situation of daughter seeking care for stem cell transplant may be contributory. He has Kardiamobile device but appears to not use regularly.   Today, he denies symptoms of palpitations, chest pain, shortness of breath, orthopnea, PND, lower extremity edema, dizziness, presyncope, syncope, or neurologic sequela. The patient is tolerating medications without difficulties and is otherwise without complaint today.   Past Medical History:  Diagnosis Date   Fatigue    Hyperlipidemia    Mitral valve prolapse    Paroxysmal A-fib Gabriel Schmidt)    Past Surgical History:  Procedure Laterality Date   CARDIAC CATHETERIZATION  06/19/2004   CARDIOVERSION  04/14/2006   TONSILLECTOMY AND ADENOIDECTOMY      Current Outpatient Medications  Medication Sig Dispense Refill   ammonium lactate (AMLACTIN) 12 % cream Apply 1 Application topically once.     atenolol  (TENORMIN ) 25 MG tablet TAKE 1/2 TABLET(12.5 MG) BY MOUTH TWICE DAILY (Patient taking differently: Take 12.5 mg by mouth daily.) 90 tablet 3   ELIQUIS  5 MG TABS tablet TAKE 1  TABLET(5 MG) BY MOUTH TWICE DAILY 180 tablet 1   famotidine (PEPCID) 20 MG tablet Take 1 tablet by mouth at bedtime as needed.     flecainide  (TAMBOCOR ) 50 MG tablet Take 1 tablet (50 mg total) by mouth 2 (two) times daily. 180 tablet 1   No current facility-administered medications for this encounter.    Allergies  Allergen Reactions   Clindamycin/Lincomycin    Penicillins Rash    Has patient had a PCN reaction causing immediate rash, facial/tongue/throat swelling, SOB or lightheadedness with hypotension: No Has patient had a PCN reaction causing severe rash involving mucus membranes or skin necrosis: No Has patient had a PCN reaction that required hospitalization No Has patient had a PCN reaction occurring within the last 10 years: No If all of the above answers are NO, then may proceed with Cephalosporin use.    ROS- All systems are reviewed and negative except as per the HPI above  Physical Exam: Vitals:   11/26/23 0822  BP: 114/70  Pulse: 60  Weight: 87.3 kg  Height: 6' 2 (1.88 m)   GEN- The patient is well appearing, alert and oriented x 3 today.   Neck - no JVD or carotid bruit noted Lungs- Clear to ausculation bilaterally, normal work of breathing Heart- Regular rate and rhythm with ectopy noted, no murmurs, rubs or gallops, PMI not laterally displaced Extremities- no clubbing, cyanosis, or edema Skin - no rash or ecchymosis noted   EKG-  Vent. rate 60 BPM PR  interval 202 ms QRS duration 84 ms QT/QTcB 426/426 ms P-R-T axes 68 2 38 Sinus rhythm with Premature atrial complexes Otherwise normal ECG When compared with ECG of 19-Nov-2023 22:18, PREVIOUS ECG IS PRESENT  Assessment and Plan: 1. PAF  Patient is currently in NSR. We discussed his ongoing rhythm control treatment with flecainide  50 mg BID. Due to this episode being first in years, discussion with patient to continue conservative observation without regimen change. He transitioned on his own to  taking one tablet of atenolol  and okay to continue with monitoring HR at home. Recommended to patient to monitor rhythm with Kardiamobile device. Advised to contact clinic if notes increased Afib burden.   High risk medication monitoring (ICD10: [id number]) Patient requires ongoing monitoring for anti-arrhythmic medication which has the potential to cause life threatening arrhythmias or AV block. ECG intervals are stable. Continue flecainide  50 mg BID. Continue atenolol  25 mg daily.   Negative cardiac cath in 2006.  2. Chadsvasc score of 2 Continue Eliquis  without interruption.   Follow up as scheduled with Dr. Francyne in November. Follow up May Afib clinic.    Dorn Heinrich, PA-C [facility] [address] [place], [place] [address] [phone]

## 2023-11-27 ENCOUNTER — Inpatient Hospital Stay: Payer: Medicare (Managed Care)

## 2023-11-27 VITALS — BP 122/56 | HR 54 | Temp 97.7°F | Resp 20 | Wt 193.2 lb

## 2023-11-27 DIAGNOSIS — D72821 Monocytosis (symptomatic): Secondary | ICD-10-CM | POA: Insufficient documentation

## 2023-11-27 DIAGNOSIS — Z79899 Other long term (current) drug therapy: Secondary | ICD-10-CM | POA: Insufficient documentation

## 2023-11-27 DIAGNOSIS — K219 Gastro-esophageal reflux disease without esophagitis: Secondary | ICD-10-CM | POA: Diagnosis not present

## 2023-11-27 DIAGNOSIS — I480 Paroxysmal atrial fibrillation: Secondary | ICD-10-CM | POA: Insufficient documentation

## 2023-11-27 DIAGNOSIS — Z7901 Long term (current) use of anticoagulants: Secondary | ICD-10-CM | POA: Insufficient documentation

## 2023-11-27 DIAGNOSIS — E7800 Pure hypercholesterolemia, unspecified: Secondary | ICD-10-CM | POA: Insufficient documentation

## 2023-12-10 ENCOUNTER — Ambulatory Visit: Payer: Medicare (Managed Care) | Admitting: Cardiology

## 2024-01-14 ENCOUNTER — Ambulatory Visit: Payer: Medicare (Managed Care) | Attending: Cardiovascular Disease | Admitting: Cardiovascular Disease

## 2024-01-14 ENCOUNTER — Encounter: Payer: Self-pay | Admitting: Cardiovascular Disease

## 2024-01-14 VITALS — BP 120/58 | HR 58 | Ht 74.0 in | Wt 192.0 lb

## 2024-01-14 DIAGNOSIS — R001 Bradycardia, unspecified: Secondary | ICD-10-CM

## 2024-01-14 DIAGNOSIS — I480 Paroxysmal atrial fibrillation: Secondary | ICD-10-CM

## 2024-01-14 DIAGNOSIS — I4589 Other specified conduction disorders: Secondary | ICD-10-CM | POA: Diagnosis not present

## 2024-01-14 MED ORDER — ATENOLOL 25 MG PO TABS: 25.0000 mg | ORAL_TABLET | Freq: Every day | ORAL | 3 refills | Status: DC | PRN

## 2024-01-14 NOTE — Patient Instructions (Addendum)
 Medication Instructions:  May take Atenolol  25 mg once daily As Needed for Afib *If you need a refill on your cardiac medications before your next appointment, please call your pharmacy*  Lab Work: None ordered If you have labs (blood work) drawn today and your tests are completely normal, you will receive your results only by: MyChart Message (if you have MyChart) OR A paper copy in the mail If you have any lab test that is abnormal or we need to change your treatment, we will call you to review the results.  Testing/Procedures: Your physician has requested that you have an echocardiogram. Echocardiography is a painless test that uses sound waves to create images of your heart. It provides your doctor with information about the size and shape of your heart and how well your heart's chambers and valves are working. This procedure takes approximately one hour. There are no restrictions for this procedure. Please do NOT wear cologne, perfume, aftershave, or lotions (deodorant is allowed). Please arrive 15 minutes prior to your appointment time.  Please note: We ask at that you not bring children with you during ultrasound (echo/ vascular) testing. Due to room size and safety concerns, children are not allowed in the ultrasound rooms during exams. Our front office staff cannot provide observation of children in our lobby area while testing is being conducted. An adult accompanying a patient to their appointment will only be allowed in the ultrasound room at the discretion of the ultrasound technician under special circumstances. We apologize for any inconvenience.   Follow-Up: At [facility], you and your health needs are our priority.  As part of our continuing mission to provide you with exceptional heart care, our providers are all part of one team.  This team includes your primary Cardiologist (physician) and Advanced Practice Providers or APPs (Physician Assistants and Nurse  Practitioners) who all work together to provide you with the care you need, when you need it.  Your next appointment:   1 year(s)  Provider:   Dr Francyne  We recommend signing up for the patient portal called MyChart.  Sign up information is provided on this After Visit Summary.  MyChart is used to connect with patients for Virtual Visits (Telemedicine).  Patients are able to view lab/test results, encounter notes, upcoming appointments, etc.  Non-urgent messages can be sent to your provider as well.   To learn more about what you can do with MyChart, go to [email or web address].

## 2024-01-14 NOTE — Progress Notes (Addendum)
 Cardiology Office Note   Date:  01/17/2024  ID:  Murriel Eidem Coykendall, DOB [birth year], MRN [id number] PCP: Dayna Motto, DO  [facility] Providers Cardiologist:  Jerel Balding, MD     History of Present Illness Aaronmichael Brumbaugh Schrack is a 89 y.o. male with infrequent episodes of atrial fibrillation, mild hypercholesterolemia, possible mitral valve prolapse, otherwise excellent health, here to transition cardiology care after Dr. Joesphine retirement.  For the first time in a couple of years she had an episode of atrial fibrillation that brought him to the emergency department 11/19/2023, where he spontaneously converted to sinus rhythm after taking an additional dose of beta-blocker.  The episode may have been triggered by increased emotional state (his daughter, who lives on [address] in [place], needs stem cell transplant for non-Hodgkin's lymphoma, level of care not available in [place]).    At follow-up in the A-fib clinic on 11/26/2023 he was in sinus rhythm.  (previously had episodes of atrial fibrillation in 2006, 2008, August 2016 and January 2017).  He usually presents with RVR and hypotension.  He has been on treatment with flecainide  which has been a good job preventing him to be arrhythmia.  He is on a very low-dose of atenolol  due to baseline bradycardia.  He has been compliant with Eliquis  anticoagulation and has not had any bleeding problems.  He is remarkably fit and active for his age.  He exercises regularly.  He goes to [employer].  Occasionally he feels lightheaded and has mild dyspnea.  His heart rate never exceeds 93 bpm even when he pushes hard.  The patient specifically denies any chest pain at rest or with exertion, dyspnea at rest or with exertion, orthopnea, paroxysmal nocturnal dyspnea, syncope, palpitations, focal neurological deficits, intermittent claudication, lower extremity edema, unexplained weight gain, cough, hemoptysis or wheezing.    Studies  Reviewed     Labs 10/30/2023 total cholesterol 205, HDL 65, triglycerides 52 Hemoglobin 13.6, creatinine 1.07, potassium 4.4  Risk Assessment/Calculations  CHA2DS2-VASc Score = 2   This indicates a 2.2% annual risk of stroke. The patient's score is based upon: CHF History: 0 HTN History: 0 Diabetes History: 0 Stroke History: 0 Vascular Disease History: 0 Age Score: 2 Gender Score: 0            Physical Exam VS:  BP (!) 120/58   Pulse (!) 58   Ht 6' 2 (1.88 m)   Wt 192 lb (87.1 kg)   SpO2 98%   BMI 24.65 kg/m        Wt Readings from Last 3 Encounters:  01/14/24 192 lb (87.1 kg)  11/27/23 193 lb 3.2 oz (87.6 kg)  11/26/23 192 lb 6.4 oz (87.3 kg)    GEN: Well nourished, well developed in no acute distress.  Appears younger than stated age. NECK: No JVD; No carotid bruits CARDIAC: RRR, no murmurs, rubs, gallops.  He does not have an audible click and murmur even with provocative maneuvers. RESPIRATORY:  Clear to auscultation without rales, wheezing or rhonchi  ABDOMEN: Soft, non-tender, non-distended EXTREMITIES:  No edema; No deformity   ASSESSMENT AND PLAN AFib: Episodes are infrequent.  He is on flecainide  plus beta-blocker and on appropriate anticoagulation.  He has never had an embolic event or stroke.  CHA2DS2-VASc score 2 for age only.  He prefers conservative management. Exertional dizziness and dyspnea: We will update his echocardiogram, but I wonder if he is describing symptoms of chronotropic incompetence.  His resting heart rate  is in the 50s and his peak heart rate is maximal exercise at the gym and there is 93.  Will stop his tiny dose of atenolol  and see if this leads to any improvement.  However he should have a supply of atenolol  25 mg tablets to take as needed if he has recurrent atrial fibrillation.  We discussed the fact that he may eventually need a pacemaker.  May consider a wearable arrhythmia monitor or a treadmill stress test before we make that  decision.  He clearly prefers conservative management.       Dispo: [facility] Patient Instructions  Medication Instructions:  May take Atenolol  25 mg once daily As Needed for Afib *If you need a refill on your cardiac medications before your next appointment, please call your pharmacy*  Lab Work: None ordered If you have labs (blood work) drawn today and your tests are completely normal, you will receive your results only by: MyChart Message (if you have MyChart) OR A paper copy in the mail If you have any lab test that is abnormal or we need to change your treatment, we will call you to review the results.  Testing/Procedures: Your physician has requested that you have an echocardiogram. Echocardiography is a painless test that uses sound waves to create images of your heart. It provides your doctor with information about the size and shape of your heart and how well your heart's chambers and valves are working. This procedure takes approximately one hour. There are no restrictions for this procedure. Please do NOT wear cologne, perfume, aftershave, or lotions (deodorant is allowed). Please arrive 15 minutes prior to your appointment time.  Please note: We ask at that you not bring children with you during ultrasound (echo/ vascular) testing. Due to room size and safety concerns, children are not allowed in the ultrasound rooms during exams. Our front office staff cannot provide observation of children in our lobby area while testing is being conducted. An adult accompanying a patient to their appointment will only be allowed in the ultrasound room at the discretion of the ultrasound technician under special circumstances. We apologize for any inconvenience.   Follow-Up: At [facility], you and your health needs are our priority.  As part of our continuing mission to provide you with exceptional heart care, our providers are all part of one team.  This team includes your primary  Cardiologist (physician) and Advanced Practice Providers or APPs (Physician Assistants and Nurse Practitioners) who all work together to provide you with the care you need, when you need it.  Your next appointment:   1 year(s)  Provider:   Dr Francyne  We recommend signing up for the patient portal called MyChart.  Sign up information is provided on this After Visit Summary.  MyChart is used to connect with patients for Virtual Visits (Telemedicine).  Patients are able to view lab/test results, encounter notes, upcoming appointments, etc.  Non-urgent messages can be sent to your provider as well.   To learn more about what you can do with MyChart, go to [email or web address].       Signed, Jerel Francyne, MD

## 2024-01-17 ENCOUNTER — Encounter: Payer: Self-pay | Admitting: Cardiovascular Disease

## 2024-02-11 ENCOUNTER — Ambulatory Visit: Payer: Self-pay | Admitting: Cardiovascular Disease

## 2024-02-11 ENCOUNTER — Ambulatory Visit (HOSPITAL_COMMUNITY)
Admission: RE | Admit: 2024-02-11 | Discharge: 2024-02-11 | Payer: Medicare (Managed Care) | Attending: Cardiology | Admitting: Cardiology

## 2024-02-11 DIAGNOSIS — I480 Paroxysmal atrial fibrillation: Secondary | ICD-10-CM | POA: Diagnosis present

## 2024-02-11 DIAGNOSIS — I6529 Occlusion and stenosis of unspecified carotid artery: Secondary | ICD-10-CM

## 2024-02-11 LAB — ECHOCARDIOGRAM COMPLETE
Area-P 1/2: 2.99 cm2
S' Lateral: 2.6 cm

## 2024-02-25 NOTE — Telephone Encounter (Signed)
 Croitoru, Jerel, MD  Davee Comer CROME, RN If he agrees, please schedule him for a duplex carotid ultrasound.  Ordered for carotid atherosclerotic calcification  Order placed and [facility] message sent

## 2024-03-01 ENCOUNTER — Ambulatory Visit (HOSPITAL_COMMUNITY)
Admission: RE | Admit: 2024-03-01 | Discharge: 2024-03-01 | Disposition: A | Discharge status: Home / Self Care | Source: Ambulatory Visit | Attending: Cardiovascular Disease | Admitting: Cardiovascular Disease

## 2024-03-01 DIAGNOSIS — I6529 Occlusion and stenosis of unspecified carotid artery: Secondary | ICD-10-CM | POA: Diagnosis present

## 2024-03-01 DIAGNOSIS — R42 Dizziness and giddiness: Secondary | ICD-10-CM | POA: Diagnosis present

## 2024-03-01 DIAGNOSIS — I480 Paroxysmal atrial fibrillation: Secondary | ICD-10-CM

## 2024-03-01 NOTE — Telephone Encounter (Signed)
" °*  STAT* If patient is at the pharmacy, call can be transferred to refill team.   1. Which medications need to be refilled? (please list name of each medication and dose if known)  ELIQUIS  5 MG TABS tablet   flecainide  (TAMBOCOR ) 50 MG tablet    2. Would you like to learn more about the convenience, safety, & potential cost savings by using the [facility]?   3. Are you open to using the [facility] (Type [facility].    4. Which pharmacy/location (including street and city if local pharmacy) is medication to be sent to?  [facility] - [place], [place] - [facility] AT Portal to Registered [facility] Sites   5. Do they need a 30 day or 90 day supply? 90  "

## 2024-03-02 MED ORDER — APIXABAN 5 MG PO TABS: 5.0000 mg | ORAL_TABLET | Freq: Two times a day (BID) | ORAL | 1 refills | Status: DC

## 2024-03-02 NOTE — Telephone Encounter (Signed)
 Pt last saw Dr Francyne 01/14/24, last labs 11/19/23 Creat 1.07, age 89, weight 87.1kg, based on specified criteria pt is on appropriate dosage of Eliquis  5mg  BID for afib.  Will refill rx.

## 2024-03-03 ENCOUNTER — Ambulatory Visit: Payer: Self-pay | Admitting: Cardiovascular Disease

## 2024-03-03 ENCOUNTER — Telehealth: Payer: Self-pay | Admitting: Pharmacy Technician

## 2024-03-03 NOTE — Telephone Encounter (Signed)
 [facility]

## 2024-03-04 ENCOUNTER — Other Ambulatory Visit: Payer: Self-pay | Admitting: Cardiovascular Disease

## 2024-03-04 DIAGNOSIS — I480 Paroxysmal atrial fibrillation: Secondary | ICD-10-CM

## 2024-03-08 MED ORDER — ATENOLOL 25 MG PO TABS: 25.0000 mg | ORAL_TABLET | Freq: Every day | ORAL | 3 refills | Status: AC | PRN

## 2024-03-15 ENCOUNTER — Telehealth: Payer: Self-pay | Admitting: Cardiovascular Disease

## 2024-03-15 NOTE — Telephone Encounter (Signed)
" °*  STAT* If patient is at the pharmacy, call can be transferred to refill team.   1. Which medications need to be refilled? (please list name of each medication and dose if known)  flecainide  (TAMBOCOR ) 50 MG tablet    ELIQUIS  5 MG TABS tablet   2. Which pharmacy/location (including street and city if local pharmacy) is medication to be sent to? [facility] - [place], [place] - One [facility] AT Portal to Registered [facility] Sites   3. Do they need a 30 day or 90 day supply?   90 day supply + refills  "

## 2024-03-17 ENCOUNTER — Telehealth: Payer: Self-pay | Admitting: Cardiovascular Disease

## 2024-03-17 NOTE — Telephone Encounter (Signed)
" °*  STAT* If patient is at the pharmacy, call can be transferred to refill team.   1. Which medications need to be refilled? (please list name of each medication and dose if known)   flecainide  (TAMBOCOR ) 50 MG tablet   2. Would you like to learn more about the convenience, safety, & potential cost savings by using the [facility]?   3. Are you open to using the [facility] (Type [facility]. ).  4. Which pharmacy/location (including street and city if local pharmacy) is medication to be sent to?  [facility] - [place], [place] - One [facility] AT Portal to Registered [facility] Sites    5. Do they need a 30 day or 90 day supply?   90 day  Patient stated he recently changed pharmacies and will need this medication sent to his new pharmacy - [facility] - [place], [place] - One [facility] AT Portal to [employer].  Patient stated he still has some medication.  "

## 2024-03-19 NOTE — Telephone Encounter (Signed)
 Patient called to follow-up on his refill being sent to [facility].  Patient stated he has a few days left of this medication.

## 2024-03-23 MED ORDER — FLECAINIDE ACETATE 50 MG PO TABS: 50.0000 mg | ORAL_TABLET | Freq: Two times a day (BID) | ORAL | 2 refills | Status: DC

## 2024-03-23 NOTE — Telephone Encounter (Signed)
 Refill sent

## 2024-03-30 ENCOUNTER — Other Ambulatory Visit: Payer: Self-pay

## 2024-03-30 MED ORDER — FLECAINIDE ACETATE 50 MG PO TABS: 50.0000 mg | ORAL_TABLET | Freq: Two times a day (BID) | ORAL | 3 refills | Status: AC

## 2024-06-25 ENCOUNTER — Ambulatory Visit (HOSPITAL_COMMUNITY): Payer: Medicare (Managed Care) | Admitting: Internal Medicine
